# Patient Record
Sex: Female | Born: 1955
Health system: Southern US, Community
[De-identification: ages and names within clinical notes are randomized; demographics above are authoritative.]

## PROBLEM LIST (undated history)

## (undated) DIAGNOSIS — I10 Essential (primary) hypertension: Secondary | ICD-10-CM

## (undated) DIAGNOSIS — M1990 Unspecified osteoarthritis, unspecified site: Secondary | ICD-10-CM

---

## 2001-08-13 ENCOUNTER — Encounter: Payer: Self-pay | Admitting: Family Medicine

## 2001-08-13 ENCOUNTER — Ambulatory Visit (HOSPITAL_COMMUNITY): Admission: RE | Admit: 2001-08-13 | Discharge: 2001-08-13 | Payer: Self-pay | Admitting: Family Medicine

## 2001-11-22 ENCOUNTER — Other Ambulatory Visit: Admission: RE | Admit: 2001-11-22 | Discharge: 2001-11-22 | Payer: Self-pay | Admitting: Family Medicine

## 2002-09-09 ENCOUNTER — Ambulatory Visit (HOSPITAL_COMMUNITY): Admission: RE | Admit: 2002-09-09 | Discharge: 2002-09-09 | Payer: Self-pay | Admitting: Family Medicine

## 2002-09-09 ENCOUNTER — Encounter: Payer: Self-pay | Admitting: Family Medicine

## 2003-10-22 ENCOUNTER — Ambulatory Visit (HOSPITAL_COMMUNITY): Admission: RE | Admit: 2003-10-22 | Discharge: 2003-10-22 | Payer: Self-pay | Admitting: Family Medicine

## 2005-04-11 ENCOUNTER — Ambulatory Visit (HOSPITAL_COMMUNITY): Admission: RE | Admit: 2005-04-11 | Discharge: 2005-04-11 | Payer: Self-pay | Admitting: Family Medicine

## 2006-07-25 ENCOUNTER — Other Ambulatory Visit: Admission: RE | Admit: 2006-07-25 | Discharge: 2006-07-25 | Payer: Self-pay | Admitting: Family Medicine

## 2006-08-08 ENCOUNTER — Ambulatory Visit (HOSPITAL_COMMUNITY): Admission: RE | Admit: 2006-08-08 | Discharge: 2006-08-08 | Payer: Self-pay | Admitting: Family Medicine

## 2007-06-21 ENCOUNTER — Ambulatory Visit (HOSPITAL_COMMUNITY): Admission: RE | Admit: 2007-06-21 | Discharge: 2007-06-21 | Payer: Self-pay | Admitting: Gastroenterology

## 2007-06-21 ENCOUNTER — Ambulatory Visit: Payer: Self-pay | Admitting: Gastroenterology

## 2007-06-21 HISTORY — PX: COLONOSCOPY: SHX174

## 2007-08-30 ENCOUNTER — Ambulatory Visit (HOSPITAL_COMMUNITY): Admission: RE | Admit: 2007-08-30 | Discharge: 2007-08-30 | Payer: Self-pay | Admitting: Family Medicine

## 2007-09-20 ENCOUNTER — Encounter: Payer: Self-pay | Admitting: Orthopedic Surgery

## 2007-09-20 ENCOUNTER — Encounter: Admission: RE | Admit: 2007-09-20 | Discharge: 2007-09-20 | Payer: Self-pay | Admitting: Family Medicine

## 2007-11-13 ENCOUNTER — Ambulatory Visit: Payer: Self-pay | Admitting: Orthopedic Surgery

## 2007-11-13 DIAGNOSIS — M7580 Other shoulder lesions, unspecified shoulder: Secondary | ICD-10-CM

## 2007-11-13 DIAGNOSIS — M25819 Other specified joint disorders, unspecified shoulder: Secondary | ICD-10-CM | POA: Insufficient documentation

## 2007-11-13 DIAGNOSIS — M25519 Pain in unspecified shoulder: Secondary | ICD-10-CM | POA: Insufficient documentation

## 2009-06-23 ENCOUNTER — Ambulatory Visit (HOSPITAL_COMMUNITY): Admission: RE | Admit: 2009-06-23 | Discharge: 2009-06-23 | Payer: Self-pay | Admitting: Family Medicine

## 2010-07-06 ENCOUNTER — Ambulatory Visit (HOSPITAL_COMMUNITY): Admission: RE | Admit: 2010-07-06 | Discharge: 2010-07-06 | Payer: Self-pay | Admitting: Family Medicine

## 2010-10-03 ENCOUNTER — Encounter: Payer: Self-pay | Admitting: Family Medicine

## 2011-01-25 NOTE — Op Note (Signed)
NAMEALVINE, Jodi            ACCOUNT NO.:  [id number]   MEDICAL RECORD NO.:  [id number]          PATIENT TYPE:  AMB   LOCATION:  DAY                           FACILITY:  [facility]   PHYSICIAN:  Kassie Mends, M.D.      DATE OF BIRTH:  [birth year]   DATE OF PROCEDURE:  06/21/2007  DATE OF DISCHARGE:                               OPERATIVE REPORT   PROCEDURE:  Colonoscopy.   INDICATION FOR EXAM:  Jodi Duran is a 55 year old female who  presents for average risk colon cancer screening.  She was supposed to  be on a clear liquid diet but had fried potatoes and pork chops at  around 5:00 p.m. today.   FINDINGS:  1. Large amount of liquid stool seen in the transverse colon and the      descending colon which contained particulate matter.  It made      aspirating liquid difficult but adequate visualization of the      colonic mucosa was able to be achieved.  However, the visualization      was not ideal and polyps less than 5 mm would been easily missed.  2. Otherwise no polyps, masses, inflammatory changes, arteriovenous      malformations or diverticula seen.  3. Small internal hemorrhoids.  Otherwise normal retroflexed view of      the rectum.   RECOMMENDATIONS:  1. Jodi Duran should return in 5 years to have a screening      colonoscopy and should strictly adhere to a clear liquid diet.  I      did stress that to her.  2. She should follow a high fiber diet.  She is given a handout on      high-fiber diet and hemorrhoids.   MEDICATIONS:  1. Demerol 75 mg IV.  2. Versed 4 mg IV.   PROCEDURE TECHNIQUE:  Physical exam was performed.  Informed consent was  obtained from the patient after explaining benefits, risks and  alternatives to the procedure.  The patient was connected to the monitor  and placed in left lateral position.  Continuous oxygen was provided by  nasal cannula and IV medicine administered through an indwelling  cannula.  After administration of sedation and  rectal exam, the  patient's rectum intubated.  The scope was  advanced under direct visualization to the cecum.  The scope was removed  slowly by carefully examine the color, texture, anatomy and integrity  mucosa on the way out.  The patient was recovered in endoscopy  discharged home in satisfactory condition.      Kassie Mends, M.D.  Electronically Signed     SM/MEDQ  D:  06/21/2007  T:  06/21/2007  Job:  [id number]   cc:   Jodi Duran. Jodi Chance, MD  Fax: [phone]

## 2011-07-19 ENCOUNTER — Other Ambulatory Visit (HOSPITAL_COMMUNITY): Payer: Self-pay | Admitting: Family Medicine

## 2011-07-19 DIAGNOSIS — Z1231 Encounter for screening mammogram for malignant neoplasm of breast: Secondary | ICD-10-CM

## 2011-07-21 ENCOUNTER — Ambulatory Visit (HOSPITAL_COMMUNITY)
Admission: RE | Admit: 2011-07-21 | Discharge: 2011-07-21 | Disposition: A | Discharge status: Home / Self Care | Payer: Self-pay | Source: Ambulatory Visit | Attending: Family Medicine | Admitting: Family Medicine

## 2011-07-21 DIAGNOSIS — Z1231 Encounter for screening mammogram for malignant neoplasm of breast: Secondary | ICD-10-CM

## 2012-05-31 ENCOUNTER — Encounter: Payer: Self-pay | Admitting: *Deleted

## 2012-06-29 ENCOUNTER — Other Ambulatory Visit (HOSPITAL_COMMUNITY): Payer: Self-pay | Admitting: *Deleted

## 2012-06-29 DIAGNOSIS — Z1231 Encounter for screening mammogram for malignant neoplasm of breast: Secondary | ICD-10-CM

## 2012-07-03 ENCOUNTER — Other Ambulatory Visit (HOSPITAL_COMMUNITY): Payer: Self-pay | Admitting: *Deleted

## 2012-07-03 DIAGNOSIS — Z139 Encounter for screening, unspecified: Secondary | ICD-10-CM

## 2012-07-10 ENCOUNTER — Ambulatory Visit (HOSPITAL_COMMUNITY): Payer: Self-pay

## 2012-07-23 ENCOUNTER — Ambulatory Visit (HOSPITAL_COMMUNITY)
Admission: RE | Admit: 2012-07-23 | Discharge: 2012-07-23 | Disposition: A | Discharge status: Home / Self Care | Payer: Self-pay | Source: Ambulatory Visit | Attending: *Deleted | Admitting: *Deleted

## 2012-07-23 DIAGNOSIS — Z139 Encounter for screening, unspecified: Secondary | ICD-10-CM

## 2013-02-11 DIAGNOSIS — H16009 Unspecified corneal ulcer, unspecified eye: Secondary | ICD-10-CM | POA: Insufficient documentation

## 2013-07-25 ENCOUNTER — Other Ambulatory Visit (HOSPITAL_COMMUNITY): Payer: Self-pay | Admitting: Family Medicine

## 2013-07-25 DIAGNOSIS — Z1231 Encounter for screening mammogram for malignant neoplasm of breast: Secondary | ICD-10-CM

## 2013-08-01 ENCOUNTER — Ambulatory Visit (HOSPITAL_COMMUNITY)
Admission: RE | Admit: 2013-08-01 | Discharge: 2013-08-01 | Disposition: A | Discharge status: Home / Self Care | Payer: Self-pay | Source: Ambulatory Visit | Attending: Family Medicine | Admitting: Family Medicine

## 2013-08-01 DIAGNOSIS — Z1231 Encounter for screening mammogram for malignant neoplasm of breast: Secondary | ICD-10-CM

## 2014-05-07 ENCOUNTER — Telehealth: Payer: Self-pay

## 2014-05-07 NOTE — Telephone Encounter (Signed)
Pt called on her work break to schedule tcs. She said she was not having any problems that she was just due for one. I told her that the triage nurse was covering for another nurse today and she would have to call her back. Pt said that she was having to call from work and doesn't get home until after we have closed. Patient said that she would have to call DS back later. I took her phone numbers anyway. [phone] home and work is [phone]

## 2014-05-13 NOTE — Telephone Encounter (Signed)
Pt called and is scheduled for OV with Laban Emperor, NP on 06/16/2014 at 3:00 PM. She had colonoscopy on 06/21/2007 and the next was recommended in 5 years.

## 2014-06-16 ENCOUNTER — Encounter (INDEPENDENT_AMBULATORY_CARE_PROVIDER_SITE_OTHER): Payer: Self-pay

## 2014-06-16 ENCOUNTER — Ambulatory Visit (INDEPENDENT_AMBULATORY_CARE_PROVIDER_SITE_OTHER): Payer: BC Managed Care – PPO | Admitting: Gastroenterology

## 2014-06-16 ENCOUNTER — Encounter: Payer: Self-pay | Admitting: Gastroenterology

## 2014-06-16 VITALS — BP 116/71 | HR 72 | Temp 97.0°F | Ht 67.0 in | Wt 248.8 lb

## 2014-06-16 DIAGNOSIS — Z1211 Encounter for screening for malignant neoplasm of colon: Secondary | ICD-10-CM

## 2014-06-18 DIAGNOSIS — Z1211 Encounter for screening for malignant neoplasm of colon: Secondary | ICD-10-CM | POA: Insufficient documentation

## 2014-06-18 DIAGNOSIS — Z136 Encounter for screening for cardiovascular disorders: Secondary | ICD-10-CM | POA: Insufficient documentation

## 2014-06-18 NOTE — Progress Notes (Signed)
Pt presented for visit prior to colonoscopy. No issues. Triaged per nursing. No prior history of polyps. TCS in 2008 with poor prep, so early interval colonoscopy is warranted. No charge; patient triaged.

## 2014-06-24 ENCOUNTER — Other Ambulatory Visit: Payer: Self-pay

## 2014-06-24 ENCOUNTER — Telehealth: Payer: Self-pay

## 2014-06-24 DIAGNOSIS — Z1211 Encounter for screening for malignant neoplasm of colon: Secondary | ICD-10-CM

## 2014-06-24 NOTE — Telephone Encounter (Signed)
PREPOPIK-DRINK WATER TO KEEP URINE LIGHT YELLOW.  PT SHOULD DROP OFF RX 3 DAYS PRIOR TO PROCEDURE.  

## 2014-06-24 NOTE — Telephone Encounter (Signed)
Gastroenterology Pre-Procedure Review  Request Date: 06/16/2014 Requesting Physician: Dr. Iona Beard  PATIENT REVIEW QUESTIONS: The patient responded to the following health history questions as indicated:    1. Diabetes Melitis: no 2. Joint replacements in the past 12 months: no 3. Major health problems in the past 3 months: no 4. Has an artificial valve or MVP: no 5. Has a defibrillator: no 6. Has been advised in past to take antibiotics in advance of a procedure like teeth cleaning: no    MEDICATIONS & ALLERGIES:    Patient reports the following regarding taking any blood thinners:   Plavix? no Aspirin? no Coumadin? no  Patient confirms/reports the following medications:  Current Outpatient Prescriptions  Medication Sig Dispense Refill  . acetaminophen (TYLENOL) 325 MG tablet Take 650 mg by mouth every 6 (six) hours as needed.      . hydrochlorothiazide (HYDRODIURIL) 25 MG tablet Take 25 mg by mouth daily.       No current facility-administered medications for this visit.    Patient confirms/reports the following allergies:  No Known Allergies  No orders of the defined types were placed in this encounter.    AUTHORIZATION INFORMATION Primary Insurance:   ID #: Group #:  Pre-Cert / Auth required:  Pre-Cert / Auth #:   Secondary Insurance:   ID #:  Group #:  Pre-Cert / Auth required:  Pre-Cert / Auth #:   SCHEDULE INFORMATION: Procedure has been scheduled as follows:  Date:  07/11/2014               Time: 10:30 AM Location: [facility] Short Stay  This Gastroenterology Pre-Precedure Review Form is being routed to the following provider(s): Barney Drain, MD

## 2014-06-25 MED ORDER — PEG-KCL-NACL-NASULF-NA ASC-C 100 G PO SOLR: 1.0000 | ORAL | Status: DC

## 2014-06-25 NOTE — Telephone Encounter (Signed)
Rx sent to the pharmacy and instructions mailed to pt.  

## 2014-07-02 ENCOUNTER — Encounter (HOSPITAL_COMMUNITY): Payer: Self-pay | Admitting: Pharmacy Technician

## 2014-07-07 ENCOUNTER — Telehealth: Payer: Self-pay

## 2014-07-07 MED ORDER — PEG-KCL-NACL-NASULF-NA ASC-C 100 G PO SOLR: 1.0000 | ORAL | Status: DC

## 2014-07-07 MED ORDER — PEG 3350-KCL-NA BICARB-NACL 420 G PO SOLR: 4000.0000 mL | ORAL | Status: DC

## 2014-07-07 NOTE — Telephone Encounter (Signed)
Pt called and cannot afford the prepopik.

## 2014-07-07 NOTE — Telephone Encounter (Signed)
I sent in Movie prep and it is too expensive also. I spoke to the pharmacist and asked them to cancel those two and I will send in the Trilyte Prep and fax instructions for that to the pharmacy. Pt was aware that her instructions would be faxed to [facility].

## 2014-07-11 ENCOUNTER — Encounter (HOSPITAL_COMMUNITY)
Admission: RE | Disposition: A | Discharge status: Home / Self Care | Payer: Self-pay | Source: Ambulatory Visit | Attending: Gastroenterology

## 2014-07-11 ENCOUNTER — Ambulatory Visit (HOSPITAL_COMMUNITY)
Admission: RE | Admit: 2014-07-11 | Discharge: 2014-07-11 | Disposition: A | Discharge status: Home / Self Care | Payer: BC Managed Care – PPO | Source: Ambulatory Visit | Attending: Gastroenterology | Admitting: Gastroenterology

## 2014-07-11 ENCOUNTER — Encounter (HOSPITAL_COMMUNITY): Payer: Self-pay | Admitting: *Deleted

## 2014-07-11 DIAGNOSIS — I10 Essential (primary) hypertension: Secondary | ICD-10-CM | POA: Insufficient documentation

## 2014-07-11 DIAGNOSIS — F17210 Nicotine dependence, cigarettes, uncomplicated: Secondary | ICD-10-CM | POA: Diagnosis not present

## 2014-07-11 DIAGNOSIS — Z1211 Encounter for screening for malignant neoplasm of colon: Secondary | ICD-10-CM | POA: Diagnosis not present

## 2014-07-11 DIAGNOSIS — K648 Other hemorrhoids: Secondary | ICD-10-CM | POA: Diagnosis not present

## 2014-07-11 DIAGNOSIS — Q438 Other specified congenital malformations of intestine: Secondary | ICD-10-CM | POA: Diagnosis not present

## 2014-07-11 DIAGNOSIS — K635 Polyp of colon: Secondary | ICD-10-CM

## 2014-07-11 DIAGNOSIS — D125 Benign neoplasm of sigmoid colon: Secondary | ICD-10-CM | POA: Insufficient documentation

## 2014-07-11 DIAGNOSIS — D123 Benign neoplasm of transverse colon: Secondary | ICD-10-CM | POA: Insufficient documentation

## 2014-07-11 HISTORY — DX: Essential (primary) hypertension: I10

## 2014-07-11 HISTORY — PX: COLONOSCOPY: SHX5424

## 2014-07-11 HISTORY — DX: Unspecified osteoarthritis, unspecified site: M19.90

## 2014-07-11 SURGERY — COLONOSCOPY
Anesthesia: Moderate Sedation

## 2014-07-11 MED ORDER — MIDAZOLAM HCL 5 MG/5ML IJ SOLN
INTRAMUSCULAR | Status: DC | PRN
  Administered 2014-07-11 (×2): 2 mg via INTRAVENOUS
  Administered 2014-07-11: 1 mg via INTRAVENOUS

## 2014-07-11 MED ORDER — SIMETHICONE 40 MG/0.6ML PO SUSP
ORAL | Status: DC | PRN
  Administered 2014-07-11: 11:00:00

## 2014-07-11 MED ORDER — SODIUM CHLORIDE 0.9 % IV SOLN
INTRAVENOUS | Status: DC
  Administered 2014-07-11: 1000 mL via INTRAVENOUS

## 2014-07-11 MED ORDER — PROMETHAZINE HCL 25 MG/ML IJ SOLN
INTRAMUSCULAR | Status: AC
  Filled 2014-07-11: qty 1

## 2014-07-11 MED ORDER — MIDAZOLAM HCL 5 MG/5ML IJ SOLN
INTRAMUSCULAR | Status: AC
  Filled 2014-07-11: qty 10

## 2014-07-11 MED ORDER — MEPERIDINE HCL 100 MG/ML IJ SOLN
INTRAMUSCULAR | Status: DC | PRN
  Administered 2014-07-11: 25 mg via INTRAVENOUS
  Administered 2014-07-11: 50 mg via INTRAVENOUS

## 2014-07-11 MED ORDER — PROMETHAZINE HCL 25 MG/ML IJ SOLN
INTRAMUSCULAR | Status: DC | PRN
  Administered 2014-07-11: 12.5 mg via INTRAVENOUS

## 2014-07-11 MED ORDER — MEPERIDINE HCL 100 MG/ML IJ SOLN
INTRAMUSCULAR | Status: AC
  Filled 2014-07-11: qty 2

## 2014-07-11 MED ORDER — SODIUM CHLORIDE 0.9 % IJ SOLN
INTRAMUSCULAR | Status: AC
  Filled 2014-07-11: qty 10

## 2014-07-11 NOTE — H&P (Signed)
  Primary Care Physician:  Maggie Font, MD Primary Gastroenterologist:  Dr. Oneida Alar  Pre-Procedure History & Physical: HPI:  Jodi Duran is a 58 y.o. female here for [place].  Past Medical History  Diagnosis Date  . Hypertension   . Arthritis     Past Surgical History  Procedure Laterality Date  . Colonoscopy  06/21/2007    HDQ:QIWLN internal hemorrhoids/Large amount of liquid stool seen in the transverse colon and descending colon which contained particulate matter    Prior to Admission medications   Medication Sig Start Date End Date Taking? Authorizing Provider  acetaminophen (TYLENOL) 325 MG tablet Take 650 mg by mouth every 6 (six) hours as needed.   Yes Historical Provider, MD  Conj Estrogens-Bazedoxifene (DUAVEE) 0.45-20 MG TABS Take 1 tablet by mouth daily.   Yes Historical Provider, MD  hydrochlorothiazide (HYDRODIURIL) 25 MG tablet Take 25 mg by mouth daily.   Yes Historical Provider, MD  peg 3350 powder (MOVIPREP) 100 G SOLR Take 1 kit (200 g total) by mouth as directed. 07/07/14  Yes Danie Binder, MD  polyethylene glycol-electrolytes (TRILYTE) 420 G solution Take 4,000 mLs by mouth as directed. 07/07/14  Yes Danie Binder, MD    Allergies as of 06/24/2014  . (No Known Allergies)    History reviewed. No pertinent family history.  History   Social History  . Marital Status: Single    Spouse Name: N/A    Number of Children: N/A  . Years of Education: N/A   Occupational History  . Not on file.   Social History Main Topics  . Smoking status: Current Every Day Smoker -- 0.25 packs/day for 15 years    Types: Cigarettes  . Smokeless tobacco: Not on file     Comment:  5 cigarettes daily  . Alcohol Use: Yes     Comment: Socially  . Drug Use: No  . Sexual Activity: Not on file   Other Topics Concern  . Not on file   Social History Narrative  . No narrative on file    Review of Systems: See HPI, otherwise negative  ROS   Physical Exam: BP 123/78  Pulse 64  Temp(Src) 97.6 F (36.4 C) (Oral)  Resp 20  Ht _0  (1.702 m)  Wt 230 lb (104.327 kg)  BMI 36.01 kg/m2  SpO2 94% General:   Alert,  pleasant and cooperative in NAD Head:  Normocephalic and atraumatic. Neck:  Supple; Lungs:  Clear throughout to auscultation.    Heart:  Regular rate and rhythm. Abdomen:  Soft, nontender and nondistended. Normal bowel sounds, without guarding, and without rebound.   Neurologic:  Alert and  oriented x4;  grossly normal neurologically.  Impression/Plan:     SCREENING  Plan:  1. TCS TODAY

## 2014-07-11 NOTE — Discharge Instructions (Signed)
You had 5 polyps removed. You have internal hemorrhoids.   FOLLOW A HIGH FIBER DIET. AVOID ITEMS THAT CAUSE BLOATING & GAS. SEE INFO BELOW.  YOUR BIOPSY RESULTS SHOULD BE BACK IN 14 DAYS.  Next colonoscopy in 5-10 years.    Colonoscopy Care After Read the instructions outlined below and refer to this sheet in the next week. These discharge instructions provide you with general information on caring for yourself after you leave the hospital. While your treatment has been planned according to the most current medical practices available, unavoidable complications occasionally occur. If you have any problems or questions after discharge, call DR. Ahmon Tosi, [phone].  ACTIVITY  You may resume your regular activity, but move at a slower pace for the next 24 hours.   Take frequent rest periods for the next 24 hours.   Walking will help get rid of the air and reduce the bloated feeling in your belly (abdomen).   No driving for 24 hours (because of the medicine (anesthesia) used during the test).   You may shower.   Do not sign any important legal documents or operate any machinery for 24 hours (because of the anesthesia used during the test).    NUTRITION  Drink plenty of fluids.   You may resume your normal diet as instructed by your doctor.   Begin with a light meal and progress to your normal diet. Heavy or fried foods are harder to digest and may make you feel sick to your stomach (nauseated).   Avoid alcoholic beverages for 24 hours or as instructed.    MEDICATIONS  You may resume your normal medications.   WHAT YOU CAN EXPECT TODAY  Some feelings of bloating in the abdomen.   Passage of more gas than usual.   Spotting of blood in your stool or on the toilet paper  .  IF YOU HAD POLYPS REMOVED DURING THE COLONOSCOPY:  Eat a soft diet IF YOU HAVE NAUSEA, BLOATING, ABDOMINAL PAIN, OR VOMITING.    FINDING OUT THE RESULTS OF YOUR TEST Not all test results are  available during your visit. DR. Oneida Alar WILL CALL YOU WITHIN 7 DAYS OF YOUR PROCEDUE WITH YOUR RESULTS. Do not assume everything is normal if you have not heard from DR. Laniqua Torrens IN ONE WEEK, CALL HER OFFICE AT [phone].  SEEK IMMEDIATE MEDICAL ATTENTION AND CALL THE OFFICE: [phone] IF:  You have more than a spotting of blood in your stool.   Your belly is swollen (abdominal distention).   You are nauseated or vomiting.   You have a temperature over 101F.   You have abdominal pain or discomfort that is severe or gets worse throughout the day.   High-Fiber Diet A high-fiber diet changes your normal diet to include more whole grains, legumes, fruits, and vegetables. Changes in the diet involve replacing refined carbohydrates with unrefined foods. The calorie level of the diet is essentially unchanged. The Dietary Reference Intake (recommended amount) for adult males is 38 grams per day. For adult females, it is 25 grams per day. Pregnant and lactating women should consume 28 grams of fiber per day. Fiber is the intact part of a plant that is not broken down during digestion. Functional fiber is fiber that has been isolated from the plant to provide a beneficial effect in the body. PURPOSE  Increase stool bulk.   Ease and regulate bowel movements.   Lower cholesterol.  INDICATIONS THAT YOU NEED MORE FIBER  Constipation and hemorrhoids.   Uncomplicated diverticulosis (  intestine condition) and irritable bowel syndrome.   Weight management.   As a protective measure against hardening of the arteries (atherosclerosis), diabetes, and cancer.   GUIDELINES FOR INCREASING FIBER IN THE DIET  Start adding fiber to the diet slowly. A gradual increase of about 5 more grams (2 slices of whole-wheat bread, 2 servings of most fruits or vegetables, or 1 bowl of high-fiber cereal) per day is best. Too rapid an increase in fiber may result in constipation, flatulence, and bloating.   Drink  enough water and fluids to keep your urine clear or pale yellow. Water, juice, or caffeine-free drinks are recommended. Not drinking enough fluid may cause constipation.   Eat a variety of high-fiber foods rather than one type of fiber.   Try to increase your intake of fiber through using high-fiber foods rather than fiber pills or supplements that contain small amounts of fiber.   The goal is to change the types of food eaten. Do not supplement your present diet with high-fiber foods, but replace foods in your present diet.  INCLUDE A VARIETY OF FIBER SOURCES  Replace refined and processed grains with whole grains, canned fruits with fresh fruits, and incorporate other fiber sources. White rice, white breads, and most bakery goods contain little or no fiber.   Brown whole-grain rice, buckwheat oats, and many fruits and vegetables are all good sources of fiber. These include: broccoli, Brussels sprouts, cabbage, cauliflower, beets, sweet potatoes, white potatoes (skin on), carrots, tomatoes, eggplant, squash, berries, fresh fruits, and dried fruits.   Cereals appear to be the richest source of fiber. Cereal fiber is found in whole grains and bran. Bran is the fiber-rich outer coat of cereal grain, which is largely removed in refining. In whole-grain cereals, the bran remains. In breakfast cereals, the largest amount of fiber is found in those with "bran" in their names. The fiber content is sometimes indicated on the label.   You may need to include additional fruits and vegetables each day.   In baking, for 1 cup white flour, you may use the following substitutions:   1 cup whole-wheat flour minus 2 tablespoons.   1/2 cup white flour plus 1/2 cup whole-wheat flour.   Polyps, Colon  A polyp is extra tissue that grows inside your body. Colon polyps grow in the large intestine. The large intestine, also called the colon, is part of your digestive system. It is a long, hollow tube at the end of  your digestive tract where your body makes and stores stool. Most polyps are not dangerous. They are benign. This means they are not cancerous. But over time, some types of polyps can turn into cancer. Polyps that are smaller than a pea are usually not harmful. But larger polyps could someday become or may already be cancerous. To be safe, doctors remove all polyps and test them.   WHO GETS POLYPS? Anyone can get polyps, but certain people are more likely than others. You may have a greater chance of getting polyps if:  You are over 50.   You have had polyps before.   Someone in your family has had polyps.   Someone in your family has had cancer of the large intestine.   Find out if someone in your family has had polyps. You may also be more likely to get polyps if you:   Eat a lot of fatty foods   Smoke   Drink alcohol   Do not exercise  Eat too much  TREATMENT  The caregiver will remove the polyp during sigmoidoscopy or colonoscopy.    PREVENTION There is not one sure way to prevent polyps. You might be able to lower your risk of getting them if you:  Eat more fruits and vegetables and less fatty food.   Do not smoke.   Avoid alcohol.   Exercise every day.   Lose weight if you are overweight.   Eating more calcium and folate can also lower your risk of getting polyps. Some foods that are rich in calcium are milk, cheese, and broccoli. Some foods that are rich in folate are chickpeas, kidney beans, and spinach.   Hemorrhoids Hemorrhoids are dilated (enlarged) veins around the rectum. Sometimes clots will form in the veins. This makes them swollen and painful. These are called thrombosed hemorrhoids. Causes of hemorrhoids include:  Constipation.   Straining to have a bowel movement.   HEAVY LIFTING HOME CARE INSTRUCTIONS  Eat a well balanced diet and drink 6 to 8 glasses of water every day to avoid constipation. You may also use a bulk laxative.   Avoid  straining to have bowel movements.   Keep anal area dry and clean.   Do not use a donut shaped pillow or sit on the toilet for long periods. This increases blood pooling and pain.   Move your bowels when your body has the urge; this will require less straining and will decrease pain and pressure.

## 2014-07-12 NOTE — Op Note (Signed)
[facility] [address] [place], [address]   COLONOSCOPY PROCEDURE REPORT  PATIENT: Jodi Duran, Jodi Duran  MR#: [id number] BIRTHDATE: September 04, 1956 , 71  yrs. old GENDER: female ENDOSCOPIST: Barney Drain, MD REFERRED OF:BPZWCH Hill, M.D. PROCEDURE DATE:  07/11/2014 PROCEDURE:   Colonoscopy with cold biopsy polypectomy INDICATIONS:average risk for colon cancer.  LAST [id number] INCOMPLETE DUE TO [place]aird PREP IN RIGHT COLON. REMEMBERED SOME EVENTS OF LAST TCS. MEDICATIONS: Promethazine (Phenergan) 12.5 mg IV, Demerol 75 mg IV, and Versed 5 mg IV  DESCRIPTION OF PROCEDURE:    Physical exam was performed.  Informed consent was obtained from the patient after explaining the benefits, risks, and alternatives to procedure.  The patient was connected to monitor and placed in left lateral position. Continuous oxygen was provided by nasal cannula and IV medicine administered through an indwelling cannula.  After administration of sedation and rectal exam, the patients rectum was intubated and the EC-3890Li ([id number])  colonoscope was advanced under direct visualization to the cecum.  The scope was removed slowly by carefully examining the color, texture, anatomy, and integrity mucosa on the way out.  The patient was recovered in endoscopy and discharged home in satisfactory condition.    COLON FINDINGS: Six sessile polyps ranging from 3 to 31mm in size were found in the sigmoid colon, distal transverse colon, and descending colon.  A polypectomy was performed with cold forceps. , The colon was redundant.  Manual abdominal counter-pressure was used to reach the cecum, and Moderate sized internal hemorrhoids were found.  PREP QUALITY: good. CECAL W/D TIME: 17 MINS         COMPLICATIONS: None  ENDOSCOPIC IMPRESSION: 1.   Six COLON POLYPS REMOVED 2.   The LEFT COLON IS redundant 3.   Moderate sized internal hemorrhoids  RECOMMENDATIONS: HIGH FIBER DIET AWAIT BIOPSY NEXT  TCS IN 5-10 YEARS WITH AN OVERTUBE   _______________________________ eSignedBarney Drain, MD August 07, 2014 9:45 PM reCPT CODES: ICD CODES:  The ICD and CPT codes recommended by this software are interpretations from the data that the clinical staff has captured with the software.  The verification of the translation of this report to the ICD and CPT codes and modifiers is the sole responsibility of the health care institution and practicing physician where this report was generated.  [employer] will not be held responsible for the validity of the ICD and CPT codes included on this report.  AMA assumes no liability for data contained or not contained herein. CPT is a Designer, television/film set of the [employer].

## 2014-08-28 ENCOUNTER — Telehealth: Payer: Self-pay | Admitting: Gastroenterology

## 2014-08-28 NOTE — Telephone Encounter (Signed)
Called pt and LMOM to call back regarding path results

## 2014-08-28 NOTE — Telephone Encounter (Signed)
Please call pt. She had HYPERPLASTIC POLYPS removed from her colon.    FOLLOW A HIGH FIBER DIET. AVOID ITEMS THAT CAUSE BLOATING & GAS.  Next colonoscopy in 10 years WITH AN OVERTUBE.

## 2014-08-29 NOTE — Telephone Encounter (Signed)
Pt called and is aware of results from colonoscopy.

## 2014-09-11 NOTE — Telephone Encounter (Signed)
Reminder in epic °

## 2015-06-18 ENCOUNTER — Other Ambulatory Visit (HOSPITAL_COMMUNITY): Payer: Self-pay | Admitting: *Deleted

## 2015-06-18 DIAGNOSIS — Z1231 Encounter for screening mammogram for malignant neoplasm of breast: Secondary | ICD-10-CM

## 2015-07-01 ENCOUNTER — Ambulatory Visit (HOSPITAL_COMMUNITY): Payer: Self-pay

## 2015-07-27 ENCOUNTER — Ambulatory Visit (HOSPITAL_COMMUNITY): Payer: Self-pay

## 2015-08-03 ENCOUNTER — Ambulatory Visit (HOSPITAL_COMMUNITY)
Admission: RE | Admit: 2015-08-03 | Discharge: 2015-08-03 | Disposition: A | Discharge status: Home / Self Care | Payer: PRIVATE HEALTH INSURANCE | Source: Ambulatory Visit | Attending: *Deleted | Admitting: *Deleted

## 2015-08-03 DIAGNOSIS — Z1231 Encounter for screening mammogram for malignant neoplasm of breast: Secondary | ICD-10-CM | POA: Diagnosis not present

## 2016-03-08 DIAGNOSIS — Z01419 Encounter for gynecological examination (general) (routine) without abnormal findings: Secondary | ICD-10-CM | POA: Diagnosis not present

## 2016-03-08 DIAGNOSIS — Z1151 Encounter for screening for human papillomavirus (HPV): Secondary | ICD-10-CM | POA: Diagnosis not present

## 2016-03-14 DIAGNOSIS — I10 Essential (primary) hypertension: Secondary | ICD-10-CM | POA: Diagnosis not present

## 2016-03-14 DIAGNOSIS — E785 Hyperlipidemia, unspecified: Secondary | ICD-10-CM | POA: Diagnosis not present

## 2016-03-14 DIAGNOSIS — E669 Obesity, unspecified: Secondary | ICD-10-CM | POA: Diagnosis not present

## 2016-03-14 DIAGNOSIS — Z0000 Encounter for general adult medical examination without abnormal findings: Secondary | ICD-10-CM | POA: Diagnosis not present

## 2016-05-10 ENCOUNTER — Ambulatory Visit (HOSPITAL_COMMUNITY)
Admission: RE | Admit: 2016-05-10 | Discharge: 2016-05-10 | Disposition: A | Discharge status: Home / Self Care | Payer: BLUE CROSS/BLUE SHIELD | Source: Ambulatory Visit | Attending: Family Medicine | Admitting: Family Medicine

## 2016-05-10 ENCOUNTER — Other Ambulatory Visit (HOSPITAL_COMMUNITY): Payer: Self-pay | Admitting: Family Medicine

## 2016-05-10 DIAGNOSIS — F17210 Nicotine dependence, cigarettes, uncomplicated: Secondary | ICD-10-CM | POA: Diagnosis not present

## 2016-05-10 DIAGNOSIS — F17200 Nicotine dependence, unspecified, uncomplicated: Secondary | ICD-10-CM

## 2016-09-08 DIAGNOSIS — Z23 Encounter for immunization: Secondary | ICD-10-CM | POA: Diagnosis not present

## 2016-09-08 DIAGNOSIS — E785 Hyperlipidemia, unspecified: Secondary | ICD-10-CM | POA: Diagnosis not present

## 2016-09-08 DIAGNOSIS — N393 Stress incontinence (female) (male): Secondary | ICD-10-CM | POA: Diagnosis not present

## 2016-09-08 DIAGNOSIS — I10 Essential (primary) hypertension: Secondary | ICD-10-CM | POA: Diagnosis not present

## 2016-11-09 ENCOUNTER — Other Ambulatory Visit (HOSPITAL_COMMUNITY): Payer: Self-pay | Admitting: Family Medicine

## 2016-11-09 DIAGNOSIS — Z1231 Encounter for screening mammogram for malignant neoplasm of breast: Secondary | ICD-10-CM

## 2016-11-23 ENCOUNTER — Ambulatory Visit (HOSPITAL_COMMUNITY)
Admission: RE | Admit: 2016-11-23 | Discharge: 2016-11-23 | Disposition: A | Discharge status: Home / Self Care | Payer: BLUE CROSS/BLUE SHIELD | Source: Ambulatory Visit | Attending: Family Medicine | Admitting: Family Medicine

## 2016-11-23 DIAGNOSIS — Z1231 Encounter for screening mammogram for malignant neoplasm of breast: Secondary | ICD-10-CM | POA: Diagnosis not present

## 2017-03-27 DIAGNOSIS — Z01419 Encounter for gynecological examination (general) (routine) without abnormal findings: Secondary | ICD-10-CM | POA: Diagnosis not present

## 2017-03-27 DIAGNOSIS — N393 Stress incontinence (female) (male): Secondary | ICD-10-CM | POA: Diagnosis not present

## 2017-03-27 DIAGNOSIS — N814 Uterovaginal prolapse, unspecified: Secondary | ICD-10-CM | POA: Diagnosis not present

## 2017-03-27 DIAGNOSIS — Z6837 Body mass index (BMI) 37.0-37.9, adult: Secondary | ICD-10-CM | POA: Diagnosis not present

## 2017-03-31 DIAGNOSIS — N8111 Cystocele, midline: Secondary | ICD-10-CM | POA: Diagnosis not present

## 2017-03-31 DIAGNOSIS — N814 Uterovaginal prolapse, unspecified: Secondary | ICD-10-CM | POA: Diagnosis not present

## 2017-05-20 DIAGNOSIS — N393 Stress incontinence (female) (male): Secondary | ICD-10-CM | POA: Diagnosis not present

## 2017-05-20 DIAGNOSIS — I10 Essential (primary) hypertension: Secondary | ICD-10-CM | POA: Diagnosis not present

## 2017-05-20 DIAGNOSIS — E669 Obesity, unspecified: Secondary | ICD-10-CM | POA: Diagnosis not present

## 2017-05-20 DIAGNOSIS — E785 Hyperlipidemia, unspecified: Secondary | ICD-10-CM | POA: Diagnosis not present

## 2017-09-23 DIAGNOSIS — E785 Hyperlipidemia, unspecified: Secondary | ICD-10-CM | POA: Diagnosis not present

## 2017-09-23 DIAGNOSIS — E669 Obesity, unspecified: Secondary | ICD-10-CM | POA: Diagnosis not present

## 2017-09-23 DIAGNOSIS — N393 Stress incontinence (female) (male): Secondary | ICD-10-CM | POA: Diagnosis not present

## 2017-09-23 DIAGNOSIS — I10 Essential (primary) hypertension: Secondary | ICD-10-CM | POA: Diagnosis not present

## 2019-01-18 ENCOUNTER — Ambulatory Visit (INDEPENDENT_AMBULATORY_CARE_PROVIDER_SITE_OTHER): Payer: BLUE CROSS/BLUE SHIELD | Admitting: Orthopedic Surgery

## 2019-01-18 ENCOUNTER — Other Ambulatory Visit: Payer: Self-pay

## 2019-01-18 ENCOUNTER — Encounter: Payer: Self-pay | Admitting: Orthopedic Surgery

## 2019-01-18 DIAGNOSIS — M79604 Pain in right leg: Secondary | ICD-10-CM | POA: Diagnosis not present

## 2019-01-18 DIAGNOSIS — M79605 Pain in left leg: Secondary | ICD-10-CM | POA: Diagnosis not present

## 2019-01-18 DIAGNOSIS — M170 Bilateral primary osteoarthritis of knee: Secondary | ICD-10-CM

## 2019-01-18 DIAGNOSIS — M5137 Other intervertebral disc degeneration, lumbosacral region: Secondary | ICD-10-CM | POA: Diagnosis not present

## 2019-01-18 NOTE — Progress Notes (Signed)
Chief Complaint  Patient presents with  . Leg Pain    Severe leg pain for 6 weeks starting March 58    63 year old female started having bilateral leg pain severe 10 out of 10 on March 22.  Her pain increases with lying down in bed.  She has bilateral leg pain with numbness from her knees to her toes associated with giving way symptoms and dull aching pain.  She was treated with cyclobenzaprine ibuprofen and naproxen with Prilosec combo tablet and prednisone with improvement which included improved walking and decreased pain although symptoms still persist  Past Medical History:  Diagnosis Date  . Arthritis   . Hypertension    Past Surgical History:  Procedure Laterality Date  . COLONOSCOPY  06/21/2007   TWS:FKCLE internal hemorrhoids/Large amount of liquid stool seen in the transverse colon and descending colon which contained particulate matter  . COLONOSCOPY N/A 07/11/2014   Procedure: COLONOSCOPY;  Surgeon: Danie Binder, MD;  Location: [facility];  Service: Endoscopy;  Laterality: N/A;  10:30  - moved to 10:45 - Ginger to notify pt   . Current Outpatient Medications on File Prior to Visit  Medication Sig Dispense Refill  . cyclobenzaprine (FLEXERIL) 5 MG tablet Take 5 mg by mouth 3 (three) times daily as needed for muscle spasms.    . hydrochlorothiazide (HYDRODIURIL) 25 MG tablet Take 25 mg by mouth daily.    Marland Kitchen ibuprofen (ADVIL) 800 MG tablet Take 800 mg by mouth every 8 (eight) hours as needed.    . Naproxen-Esomeprazole (VIMOVO) 500-20 MG TBEC Take by mouth.    . predniSONE (DELTASONE) 5 MG tablet Take 5 mg by mouth daily with breakfast.    . acetaminophen (TYLENOL) 325 MG tablet Take 650 mg by mouth every 6 (six) hours as needed.    Adalberto Ill Estrogens-Bazedoxifene (DUAVEE) 0.45-20 MG TABS Take 1 tablet by mouth daily.    . hydrochlorothiazide (HYDRODIURIL) 25 MG tablet Take 25 mg by mouth daily.     No current facility-administered medications on file prior to visit.      The patient exhibits signs of possible arthritis versus spinal stenosis or disc disease in the lumbar spine.  Her aching pain could be coming from the knees and the numbness tingling and giving way from the degenerative disc disease  Recommend in person visit for imaging of both knees and her lumbar spine.  Medical decision making  New problem x 2 ,  further work-up planned for each problem Data Dr. Berdine Addison has provided medical records which were reviewed are incorporated by reference and confirmed the patient symptoms medications given.  Prescription medications to continue include cyclobenzaprine and ibuprofen   She is advised to continue the medications that she is on  Encounter Diagnoses  Name Primary?  . Pain in both lower extremities Yes  . Primary osteoarthritis of both knees   . DDD (degenerative disc disease), lumbosacral     Virtual Visit via Telephone Note  I connected with Jodi Duran on 01/18/19 at 10:40 AM EDT by telephone and verified that I am speaking with the correct person using two identifiers.  Location: Patient: home  Provider: office   I discussed the limitations, risks, security and privacy concerns of performing an evaluation and management service by telephone and the availability of in person appointments. I also discussed with the patient that there may be a patient responsible charge related to this service. The patient expressed understanding and agreed to proceed.  I discussed the assessment and treatment plan with the patient. The patient was provided an opportunity to ask questions and all were answered. The patient agreed with the plan and demonstrated an understanding of the instructions.   The patient was advised to call back or seek an in-person evaluation if the symptoms worsen or if the condition fails to improve as anticipated.  I provided 8 48 sec  of non-face-to-face time during this encounter.   Arther Abbott, MD

## 2019-01-25 ENCOUNTER — Ambulatory Visit: Payer: BLUE CROSS/BLUE SHIELD | Admitting: Orthopedic Surgery

## 2019-02-01 ENCOUNTER — Ambulatory Visit (INDEPENDENT_AMBULATORY_CARE_PROVIDER_SITE_OTHER): Payer: BLUE CROSS/BLUE SHIELD

## 2019-02-01 ENCOUNTER — Encounter: Payer: Self-pay | Admitting: Orthopedic Surgery

## 2019-02-01 ENCOUNTER — Ambulatory Visit: Payer: BLUE CROSS/BLUE SHIELD | Admitting: Orthopedic Surgery

## 2019-02-01 ENCOUNTER — Other Ambulatory Visit: Payer: Self-pay

## 2019-02-01 VITALS — Temp 97.3°F | Ht 68.0 in | Wt 253.0 lb

## 2019-02-01 DIAGNOSIS — M25562 Pain in left knee: Secondary | ICD-10-CM | POA: Diagnosis not present

## 2019-02-01 DIAGNOSIS — M5440 Lumbago with sciatica, unspecified side: Secondary | ICD-10-CM | POA: Diagnosis not present

## 2019-02-01 DIAGNOSIS — M170 Bilateral primary osteoarthritis of knee: Secondary | ICD-10-CM | POA: Diagnosis not present

## 2019-02-01 DIAGNOSIS — M25561 Pain in right knee: Secondary | ICD-10-CM

## 2019-02-01 NOTE — Progress Notes (Signed)
Progress Note   Patient ID: Jodi Duran, female   DOB: [birth year], 63 y.o.   MRN: [id number]   Chief Complaint  Patient presents with  . Knee Pain    After fall in 12/03/18 right worse than left    HERE FOR RE-EVALUATION:   LAST VISIT [facility] Chief Complaint Patient presents with . Leg Pain     Severe leg pain for 6 weeks starting March 7     63 year old female started having bilateral leg pain severe 10 out of 10 on March 22.  Her pain increases with lying down in bed.  She has bilateral leg pain with numbness from her knees to her toes associated with giving way symptoms and dull aching pain.   She was treated with cyclobenzaprine ibuprofen and naproxen with Prilosec combo tablet and prednisone with improvement which included improved walking and decreased pain although symptoms still persist   Review of Systems  Constitutional: Negative for fever.  Musculoskeletal: Positive for back pain and joint pain.  Neurological: Negative for tingling, sensory change and focal weakness.   Current Meds  Medication Sig  . celecoxib (CELEBREX) 100 MG capsule TAKE 1 CAPSULE BY MOUTH TWICE DAILY FOR JOINT PAIN  . hydrochlorothiazide (HYDRODIURIL) 25 MG tablet Take 25 mg by mouth daily.  . naproxen (NAPROSYN) 500 MG tablet Take 500 mg by mouth 2 (two) times daily.    Past Medical History:  Diagnosis Date  . Arthritis   . Hypertension      No Known Allergies   Temp (!) 97.3 F (36.3 C)   Ht 5\' 8"  (1.727 m)   Wt 253 lb (114.8 kg)   BMI 38.47 kg/m    Physical Exam General appearance normal Oriented x3 normal Mood pleasant affect normal Gait NORMAL   Ortho Exam RIGHT KNEE  Inspection and palpation revealed posterior popliteal fossa was tender as was the medial and lateral joint line  range of motion is 0-1 15 No instability was detected on stress testing Muscle tone and strength was normal without tremor Skin was warm dry and intact Good pulse and temperature  were noted in the extremity Sensation revealed no abnormalities to light touch   LEFT KNEE Tenderness over the popliteal fossa medial joint line Left knee flex 125 Ligaments are stable Muscle tone is normal Skin is warm dry and intact Pulses and temperature are normal Sensation is excellent to soft touch    MEDICAL DECISION MAKING   Imaging:  X-rays were taken of her right left knee and spine  See dictated reports  X-ray show arthritis mild to moderate both knees and moderate facet arthritis L4-S1   Encounter Diagnoses  Name Primary?  . Low back pain with sciatica, sciatica laterality unspecified, unspecified back pain laterality, unspecified chronicity Yes  . Pain in both knees, unspecified chronicity      PLAN: (RX., injection, surgery,frx,mri/ct, XR 2 body ares) Commend injection both knees Continue Naprosyn Physical therapy for the back  Follow-up in a year repeat x-rays both knees   Procedure note for bilateral knee injections  Procedure note left knee injection verbal consent was obtained to inject left knee joint  Timeout was completed to confirm the site of injection  The medications used were 40 mg of Depo-Medrol and 1% lidocaine 3 cc  Anesthesia was provided by ethyl chloride and the skin was prepped with alcohol.  After cleaning the skin with alcohol a 20-gauge needle was used to inject the left knee joint. There were no complications.  A sterile bandage was applied.   Procedure note right knee injection verbal consent was obtained to inject right knee joint  Timeout was completed to confirm the site of injection  The medications used were 40 mg of Depo-Medrol and 1% lidocaine 3 cc  Anesthesia was provided by ethyl chloride and the skin was prepped with alcohol.  After cleaning the skin with alcohol a 20-gauge needle was used to inject the right knee joint. There were no complications. A sterile bandage was applied.   No orders of the defined  types were placed in this encounter.  9:42 AM 02/01/2019

## 2019-02-01 NOTE — Patient Instructions (Addendum)
Chronic Back Pain When back pain lasts longer than 3 months, it is called chronic back pain.The cause of your back pain may not be known. Some common causes include:  Wear and tear (degenerative disease) of the bones, ligaments, or disks in your back.  Inflammation and stiffness in your back (arthritis). People who have chronic back pain often go through certain periods in which the pain is more intense (flare-ups). Many people can learn to manage the pain with home care. Follow these instructions at home: Pay attention to any changes in your symptoms. Take these actions to help with your pain: Activity   Avoid bending and other activities that make the problem worse.  Maintain a proper position when standing or sitting: ? When standing, keep your upper back and neck straight, with your shoulders pulled back. Avoid slouching. ? When sitting, keep your back straight and relax your shoulders. Do not round your shoulders or pull them backward.  Do not sit or stand in one place for long periods of time.  Take brief periods of rest throughout the day. This will reduce your pain. Resting in a lying or standing position is usually better than sitting to rest.  When you are resting for longer periods, mix in some mild activity or stretching between periods of rest. This will help to prevent stiffness and pain.  Get regular exercise. Ask your health care provider what activities are safe for you.  Do not lift anything that is heavier than 10 lb (4.5 kg). Always use proper lifting technique, which includes: ? Bending your knees. ? Keeping the load close to your body. ? Avoiding twisting.  Sleep on a firm mattress in a comfortable position. Try lying on your side with your knees slightly bent. If you lie on your back, put a pillow under your knees. Managing pain  If directed, apply ice to the painful area. Your health care provider may recommend applying ice during the first 24-48 hours after  a flare-up begins. ? Put ice in a plastic bag. ? Place a towel between your skin and the bag. ? Leave the ice on for 20 minutes, 2-3 times per day.  If directed, apply heat to the affected area as often as told by your health care provider. Use the heat source that your health care provider recommends, such as a moist heat pack or a heating pad. ? Place a towel between your skin and the heat source. ? Leave the heat on for 20-30 minutes. ? Remove the heat if your skin turns bright red. This is especially important if you are unable to feel pain, heat, or cold. You may have a greater risk of getting burned.  Try soaking in a warm tub.  Take over-the-counter and prescription medicines only as told by your health care provider.  Keep all follow-up visits as told by your health care provider. This is important. Contact a health care provider if:  You have pain that is not relieved with rest or medicine. Get help right away if:  You have weakness or numbness in one or both of your legs or feet.  You have trouble controlling your bladder or your bowels.  You have nausea or vomiting.  You have pain in your abdomen.  You have shortness of breath or you faint. This information is not intended to replace advice given to you by your health care provider. Make sure you discuss any questions you have with your health care provider. Document Released: 10/06/2004   Document Revised: 04/05/2018 Document Reviewed: 03/08/2017 Elsevier Interactive Patient Education  2019 [employer].  Arthritis Arthritis is a term that is commonly used to refer to joint pain or joint disease. There are more than 100 types of arthritis. What are the causes? The most common cause of this condition is wear and tear of a joint. Other causes include:  Gout.  Inflammation of a joint.  An infection of a joint.  Sprains and other injuries near the joint.  A drug reaction or allergic reaction. In some cases, the  cause may not be known. What are the signs or symptoms? The main symptom of this condition is pain in the joint with movement. Other symptoms include:  Redness, swelling, or stiffness at a joint.  Warmth coming from the joint.  Fever.  Overall feeling of illness. How is this diagnosed? This condition may be diagnosed with a physical exam and tests, including:  Blood tests.  Urine tests.  Imaging tests, such as MRI, X-rays, or a CT scan. Sometimes, fluid is removed from a joint for testing. How is this treated? Treatment for this condition may involve:  Treatment of the cause, if it is known.  Rest.  Raising (elevating) the joint.  Applying cold or hot packs to the joint.  Medicines to improve symptoms and reduce inflammation.  Injections of a steroid such as cortisone into the joint to help reduce pain and inflammation. Depending on the cause of your arthritis, you may need to make lifestyle changes to reduce stress on your joint. These changes may include exercising more and losing weight. Follow these instructions at home: Medicines  Take over-the-counter and prescription medicines only as told by your health care provider.  Do not take aspirin to relieve pain if gout is suspected. Activity  Rest your joint if told by your health care provider. Rest is important when your disease is active and your joint feels painful, swollen, or stiff.  Avoid activities that make the pain worse. It is important to balance activity with rest.  Exercise your joint regularly with range-of-motion exercises as told by your health care provider. Try doing low-impact exercise, such as: ? Swimming. ? Water aerobics. ? Biking. ? Walking. Joint Care   If your joint is swollen, keep it elevated if told by your health care provider.  If your joint feels stiff in the morning, try taking a warm shower.  If directed, apply heat to the joint. If you have diabetes, do not apply heat  without permission from your health care provider. ? Put a towel between the joint and the hot pack or heating pad. ? Leave the heat on the area for 20-30 minutes.  If directed, apply ice to the joint: ? Put ice in a plastic bag. ? Place a towel between your skin and the bag. ? Leave the ice on for 20 minutes, 2-3 times per day.  Keep all follow-up visits as told by your health care provider. This is important. Contact a health care provider if:  The pain gets worse.  You have a fever. Get help right away if:  You develop severe joint pain, swelling, or redness.  Many joints become painful and swollen.  You develop severe back pain.  You develop severe weakness in your leg.  You cannot control your bladder or bowels. This information is not intended to replace advice given to you by your health care provider. Make sure you discuss any questions you have with your health care provider.  Document Released: 10/06/2004 Document Revised: 02/04/2016 Document Reviewed: 11/24/2014 Elsevier Interactive Patient Education  2019 [employer].   Continue naproxen

## 2019-02-06 ENCOUNTER — Other Ambulatory Visit (HOSPITAL_COMMUNITY): Payer: Self-pay | Admitting: Family Medicine

## 2019-02-06 DIAGNOSIS — Z1231 Encounter for screening mammogram for malignant neoplasm of breast: Secondary | ICD-10-CM

## 2019-02-28 ENCOUNTER — Other Ambulatory Visit: Payer: BC Managed Care – PPO

## 2019-02-28 ENCOUNTER — Other Ambulatory Visit: Payer: Self-pay | Admitting: Internal Medicine

## 2019-02-28 DIAGNOSIS — Z20822 Contact with and (suspected) exposure to covid-19: Secondary | ICD-10-CM

## 2019-03-04 ENCOUNTER — Other Ambulatory Visit: Payer: Self-pay

## 2019-03-04 ENCOUNTER — Ambulatory Visit (HOSPITAL_COMMUNITY)
Admission: RE | Admit: 2019-03-04 | Discharge: 2019-03-04 | Disposition: A | Discharge status: Home / Self Care | Payer: BC Managed Care – PPO | Source: Ambulatory Visit | Attending: Family Medicine | Admitting: Family Medicine

## 2019-03-04 DIAGNOSIS — Z1231 Encounter for screening mammogram for malignant neoplasm of breast: Secondary | ICD-10-CM | POA: Diagnosis not present

## 2019-03-07 LAB — NOVEL CORONAVIRUS, NAA: SARS-CoV-2, NAA: NOT DETECTED

## 2019-04-09 ENCOUNTER — Other Ambulatory Visit: Payer: Self-pay

## 2019-04-09 ENCOUNTER — Ambulatory Visit (HOSPITAL_COMMUNITY)
Admission: RE | Admit: 2019-04-09 | Discharge: 2019-04-09 | Disposition: A | Discharge status: Home / Self Care | Payer: BC Managed Care – PPO | Source: Ambulatory Visit | Attending: Family Medicine | Admitting: Family Medicine

## 2019-04-09 ENCOUNTER — Other Ambulatory Visit (HOSPITAL_COMMUNITY): Payer: Self-pay | Admitting: Family Medicine

## 2019-04-09 DIAGNOSIS — R600 Localized edema: Secondary | ICD-10-CM | POA: Insufficient documentation

## 2019-04-09 DIAGNOSIS — M79605 Pain in left leg: Secondary | ICD-10-CM

## 2019-04-11 ENCOUNTER — Telehealth: Payer: Self-pay | Admitting: *Deleted

## 2019-04-11 DIAGNOSIS — R600 Localized edema: Secondary | ICD-10-CM

## 2019-04-11 NOTE — Telephone Encounter (Signed)
Dr. Oneida Alar received a call from Dr. Iona Beard at the [facility] regarding this patient's DVT study done at [facility]. The patient has been having edema in her leg and the DVT scan was negative. Per Dr. Oneida Alar we will schedule this patient for office visit and a caval iliac venous scan to rule out May-Thurner syndrome. I will get our appt staff to call the patient and arrange this for the next 3-4 weeks.

## 2019-05-21 ENCOUNTER — Encounter (INDEPENDENT_AMBULATORY_CARE_PROVIDER_SITE_OTHER): Payer: Self-pay | Admitting: Vascular Surgery

## 2019-05-21 ENCOUNTER — Other Ambulatory Visit: Payer: Self-pay

## 2019-05-21 ENCOUNTER — Ambulatory Visit (INDEPENDENT_AMBULATORY_CARE_PROVIDER_SITE_OTHER): Payer: BC Managed Care – PPO | Admitting: Vascular Surgery

## 2019-05-21 DIAGNOSIS — M7989 Other specified soft tissue disorders: Secondary | ICD-10-CM | POA: Diagnosis not present

## 2019-05-21 DIAGNOSIS — I10 Essential (primary) hypertension: Secondary | ICD-10-CM

## 2019-05-21 DIAGNOSIS — M7122 Synovial cyst of popliteal space [Baker], left knee: Secondary | ICD-10-CM | POA: Diagnosis not present

## 2019-05-21 NOTE — Progress Notes (Signed)
Patient ID: Jodi Duran, female   DOB: 12-11-1955, 63 y.o.   MRN: [id number]  Chief Complaint  Patient presents with  . New Patient (Initial Visit)    ref Hill left leg pain,edema,and possible baker cyst    HPI Jodi Duran is a 63 y.o. female.  I am asked to see the patient by Dr. Berdine Addison for evaluation of leg pain and swelling.  The patient reports having a fall a few months ago, and since that time noticing increasing pain in her left leg associated with worsening swelling.  The swelling has been persistent and it affects her lower leg up to her knee.  She has tried stockings with minimal relief.  She has tried to elevate her legs without much improvement.  She really has no right leg symptoms.  She had a basic ultrasound done about 6 weeks ago which showed no DVT in the left leg but no mention was made of reflux or obstruction.  A Baker's cyst was noted in the left popliteal space and was fairly sizable.  She has no previous history of DVT or superficial thrombophlebitis.     Past Medical History:  Diagnosis Date  . Arthritis   . Hypertension     Past Surgical History:  Procedure Laterality Date  . COLONOSCOPY  06/21/2007   [id number] internal hemorrhoids/Large amount of liquid stool seen in the transverse colon and descending colon which contained particulate matter  . COLONOSCOPY N/A 07/11/2014   Procedure: COLONOSCOPY;  Surgeon: Danie Binder, MD;  Location: [facility];  Service: Endoscopy;  Laterality: N/A;  10:30  - moved to 10:45 - Ginger to notify pt    Family History Family History  Problem Relation Age of Onset  . Diabetes Mother   . Congestive Heart Failure Mother   no bleeding or clotting disorders No aneurysms  Social History Social History   Tobacco Use  . Smoking status: Former Smoker    Packs/day: 0.25    Years: 15.00    Pack years: 3.75    Types: Cigarettes  . Smokeless tobacco: Never Used  . Tobacco comment:  5 cigarettes daily   Substance Use Topics  . Alcohol use: Yes    Comment: Socially  . Drug use: No     No Known Allergies  Current Outpatient Medications  Medication Sig Dispense Refill  . hydrochlorothiazide (HYDRODIURIL) 25 MG tablet Take 25 mg by mouth daily.    . predniSONE (DELTASONE) 5 MG tablet Take 5 mg by mouth daily with breakfast.    . traMADol (ULTRAM) 50 MG tablet TAKE 1 TABLET BY MOUTH IN THE MORNING AND AFTER SUPPER    . celecoxib (CELEBREX) 100 MG capsule TAKE 1 CAPSULE BY MOUTH TWICE DAILY FOR JOINT PAIN    . Conj Estrogens-Bazedoxifene (DUAVEE) 0.45-20 MG TABS Take 1 tablet by mouth daily.    . cyclobenzaprine (FLEXERIL) 5 MG tablet Take 5 mg by mouth 3 (three) times daily as needed for muscle spasms.    . hydrochlorothiazide (HYDRODIURIL) 25 MG tablet Take 25 mg by mouth daily.    Marland Kitchen ibuprofen (ADVIL) 800 MG tablet Take 800 mg by mouth every 8 (eight) hours as needed.    . naproxen (NAPROSYN) 500 MG tablet Take 500 mg by mouth 2 (two) times daily.    . Naproxen-Esomeprazole (VIMOVO) 500-20 MG TBEC Take by mouth.     No current facility-administered medications for this visit.       REVIEW OF SYSTEMS (Negative  unless checked)  Constitutional: [] Weight loss  [] Fever  [] Chills Cardiac: [] Chest pain   [] Chest pressure   [] Palpitations   [] Shortness of breath when laying flat   [] Shortness of breath at rest   [] Shortness of breath with exertion. Vascular:  [] Pain in legs with walking   [] Pain in legs at rest   [] Pain in legs when laying flat   [] Claudication   [] Pain in feet when walking  [] Pain in feet at rest  [] Pain in feet when laying flat   [] History of DVT   [] Phlebitis   [x] Swelling in legs   [] Varicose veins   [] Non-healing ulcers Pulmonary:   [] Uses home oxygen   [] Productive cough   [] Hemoptysis   [] Wheeze  [] COPD   [] Asthma Neurologic:  [] Dizziness  [] Blackouts   [] Seizures   [] History of stroke   [] History of TIA  [] Aphasia   [] Temporary blindness   [] Dysphagia   [] Weakness or  numbness in arms   [] Weakness or numbness in legs Musculoskeletal:  [x] Arthritis   [] Joint swelling   [] Joint pain   [] Low back pain Hematologic:  [] Easy bruising  [] Easy bleeding   [] Hypercoagulable state   [] Anemic  [] Hepatitis Gastrointestinal:  [] Blood in stool   [] Vomiting blood  [] Gastroesophageal reflux/heartburn   [] Abdominal pain Genitourinary:  [] Chronic kidney disease   [] Difficult urination  [] Frequent urination  [] Burning with urination   [] Hematuria Skin:  [] Rashes   [] Ulcers   [] Wounds Psychological:  [] History of anxiety   []  History of major depression.    Physical Exam BP 120/77 (BP Location: Right Arm)   Pulse 73   Resp 16   Ht 5\' 8"  (1.727 m)   Wt 259 lb (117.5 kg)   BMI 39.38 kg/m  Gen:  WD/WN, NAD.  Obese Head: [place]/AT, No temporalis wasting. Ear/Nose/Throat: Hearing grossly intact, nares w/o erythema or drainage, oropharynx w/o Erythema/Exudate Eyes: Conjunctiva clear, sclera non-icteric  Neck: trachea midline.  No JVD.  Pulmonary:  Good air movement, respirations not labored, no use of accessory muscles  Cardiac: RRR, no JVD Vascular:  Vessel Right Left  Radial Palpable Palpable                          DP 2+ 2+  PT 2+ 1+   Gastrointestinal:. No masses, surgical incisions, or scars. Musculoskeletal: M/S 5/5 throughout.  Extremities without ischemic changes.  No deformity or atrophy. 1+ LLE edema. Neurologic: Sensation grossly intact in extremities.  Symmetrical.  Speech is fluent. Motor exam as listed above. Psychiatric: Judgment intact, Mood & affect appropriate for pt's clinical situation. Dermatologic: No rashes or ulcers noted.  No cellulitis or open wounds.    Radiology No results found.  Labs Recent Results (from the past 2160 hour(s))  Novel Coronavirus, NAA ([facility])     Status: None   Collection Time: 02/28/19  1:27 PM   Specimen: Nasal Swab  Result Value Ref Range   SARS-CoV-2, NAA Not Detected Not Detected    Comment: This test  was developed and its performance characteristics determined by [employer]. This test has not been FDA cleared or approved. This test has been authorized by FDA under an Emergency Use Authorization (EUA). This test is only authorized for the duration of time the declaration that circumstances exist justifying the authorization of the emergency use of in vitro diagnostic tests for detection of SARS-CoV-2 virus and/or diagnosis of COVID-19 infection under section 564(b)(1) of the Act, 21 U.S.C. [id number]), unless the authorization  is terminated or revoked sooner. When diagnostic testing is negative, the possibility of a false negative result should be considered in the context of a patient's recent exposures and the presence of clinical signs and symptoms consistent with COVID-19. An individual without symptoms of COVID-19 and who is not shedding SARS-CoV-2 virus would expect to have a negative (not detected) result in this assay.     Assessment/Plan:  Essential hypertension blood pressure control important in reducing the progression of atherosclerotic disease. On appropriate oral medications.   Baker's cyst of knee, left She has a pretty good size left Baker's cyst which is clearly a contributing factor to her left leg pain and swelling.  I suspect that this happened somewhat after her fall and has been exacerbated by issues with her knee that are ongoing.  I recommend she try some anti-inflammatories and elevating the leg.  We are going to do a venous work-up but if that does not improve her symptoms, we may have to consider referral to an orthopedist for further evaluation.  Swelling of limb I have had a long discussion with the patient regarding swelling and why it  causes symptoms.  Patient will begin wearing graduated compression stockings class 1 (20-30 mmHg) on a daily basis a prescription was given. The patient will  beginning wearing the stockings first thing in the  morning and removing them in the evening. The patient is instructed specifically not to sleep in the stockings.   In addition, behavioral modification will be initiated.  This will include frequent elevation, use of over the counter pain medications and exercise such as walking.  I have reviewed systemic causes for chronic edema such as liver, kidney and cardiac etiologies.  The patient denies problems with these organ systems.    Consideration for a lymph pump will also be made based upon the effectiveness of conservative therapy.  This would help to improve the edema control and prevent sequela such as ulcers and infections   Patient should undergo duplex ultrasound of the venous system to ensure that DVT or reflux is not present.  The patient will follow-up with me after the ultrasound.        Leotis Pain 05/21/2019, 10:00 AM   This note was created with Dragon medical transcription system.  Any errors from dictation are unintentional.

## 2019-05-21 NOTE — Patient Instructions (Signed)

## 2019-05-21 NOTE — Assessment & Plan Note (Signed)
blood pressure control important in reducing the progression of atherosclerotic disease. On appropriate oral medications.  

## 2019-05-21 NOTE — Assessment & Plan Note (Signed)

## 2019-05-21 NOTE — Assessment & Plan Note (Signed)
She has a pretty good size left Baker's cyst which is clearly a contributing factor to her left leg pain and swelling.  I suspect that this happened somewhat after her fall and has been exacerbated by issues with her knee that are ongoing.  I recommend she try some anti-inflammatories and elevating the leg.  We are going to do a venous work-up but if that does not improve her symptoms, we may have to consider referral to an orthopedist for further evaluation.

## 2019-05-29 ENCOUNTER — Ambulatory Visit (INDEPENDENT_AMBULATORY_CARE_PROVIDER_SITE_OTHER): Payer: BC Managed Care – PPO | Admitting: Nurse Practitioner

## 2019-05-29 ENCOUNTER — Ambulatory Visit (INDEPENDENT_AMBULATORY_CARE_PROVIDER_SITE_OTHER): Payer: BC Managed Care – PPO

## 2019-05-29 ENCOUNTER — Encounter (INDEPENDENT_AMBULATORY_CARE_PROVIDER_SITE_OTHER): Payer: BC Managed Care – PPO

## 2019-05-29 ENCOUNTER — Other Ambulatory Visit: Payer: Self-pay

## 2019-05-29 ENCOUNTER — Encounter (INDEPENDENT_AMBULATORY_CARE_PROVIDER_SITE_OTHER): Payer: Self-pay | Admitting: Nurse Practitioner

## 2019-05-29 ENCOUNTER — Encounter (INDEPENDENT_AMBULATORY_CARE_PROVIDER_SITE_OTHER): Payer: Self-pay

## 2019-05-29 VITALS — BP 132/83 | HR 70 | Resp 16 | Wt 255.8 lb

## 2019-05-29 DIAGNOSIS — R600 Localized edema: Secondary | ICD-10-CM

## 2019-05-29 DIAGNOSIS — I10 Essential (primary) hypertension: Secondary | ICD-10-CM | POA: Diagnosis not present

## 2019-05-29 DIAGNOSIS — I83812 Varicose veins of left lower extremities with pain: Secondary | ICD-10-CM | POA: Diagnosis not present

## 2019-05-29 DIAGNOSIS — M7122 Synovial cyst of popliteal space [Baker], left knee: Secondary | ICD-10-CM

## 2019-05-29 DIAGNOSIS — M7989 Other specified soft tissue disorders: Secondary | ICD-10-CM

## 2019-05-30 ENCOUNTER — Ambulatory Visit: Payer: BC Managed Care – PPO | Admitting: Vascular Surgery

## 2019-05-30 ENCOUNTER — Ambulatory Visit (HOSPITAL_COMMUNITY): Payer: BC Managed Care – PPO

## 2019-06-06 ENCOUNTER — Encounter (INDEPENDENT_AMBULATORY_CARE_PROVIDER_SITE_OTHER): Payer: Self-pay | Admitting: Nurse Practitioner

## 2019-06-06 NOTE — Progress Notes (Signed)
SUBJECTIVE:  Patient ID: Jodi Duran, female    DOB: October 02, 1955, 63 y.o.   MRN: [id number] Chief Complaint  Patient presents with  . Follow-up    ultrasound follow up    HPI  Jodi Duran is a 63 y.o. female that presents today for evaluation of leg pain and swelling.  The patient had a fall a few months ago and since that time she has been having increasing pain in her left leg with worse swelling.  The swelling is persistent and it affects her calf area up to her knee.  She has been wearing medical grade 1 compression stockings but it does not provide relief.  She is trying to elevate her legs as much as possible but that too provides little improvement.  The right leg is generally not affected.  About 6 weeks ago she had an ultrasound which revealed no evidence of DVT however a Baker's cyst was noted that was fairly sizable.  Since her last visit, the patient has been alternating tramadol and Tylenol and has noted that the swelling seems to have decreased and the pain is also decreased.  The patient denies any fever, chills, nausea, vomiting or diarrhea.  Today the patient underwent a lower extremity venous reflux study which revealed no evidence of DVT or superficial venous thrombosis.  The patient does have reflux present within the common femoral vein and great saphenous vein of the left lower extremity.  The patient has spontaneous and phasic flow throughout the leg which would likely rule out May Thurner syndrome.  The patient also has a fluid collection in the posterior calf measuring 5.78 cm x 0.92 cm x 3.13 cm which appears to have decreased in size compared to the previous exam on 04/09/2019.  Past Medical History:  Diagnosis Date  . Arthritis   . Hypertension     Past Surgical History:  Procedure Laterality Date  . COLONOSCOPY  06/21/2007   [id number] internal hemorrhoids/Large amount of liquid stool seen in the transverse colon and descending colon which contained  particulate matter  . COLONOSCOPY N/A 07/11/2014   Procedure: COLONOSCOPY;  Surgeon: Danie Binder, MD;  Location: [facility];  Service: Endoscopy;  Laterality: N/A;  10:30  - moved to 10:45 - Ginger to notify pt    Social History   Socioeconomic History  . Marital status: Single    Spouse name: Not on file  . Number of children: Not on file  . Years of education: Not on file  . Highest education level: Not on file  Occupational History  . Not on file  Social Needs  . Financial resource strain: Not on file  . Food insecurity    Worry: Not on file    Inability: Not on file  . Transportation needs    Medical: Not on file    Non-medical: Not on file  Tobacco Use  . Smoking status: Former Smoker    Packs/day: 0.25    Years: 15.00    Pack years: 3.75    Types: Cigarettes  . Smokeless tobacco: Never Used  . Tobacco comment:  5 cigarettes daily  Substance and Sexual Activity  . Alcohol use: Yes    Comment: Socially  . Drug use: No  . Sexual activity: Not on file  Lifestyle  . Physical activity    Days per week: Not on file    Minutes per session: Not on file  . Stress: Not on file  Relationships  . Social  connections    Talks on phone: Not on file    Gets together: Not on file    Attends religious service: Not on file    Active member of club or organization: Not on file    Attends meetings of clubs or organizations: Not on file    Relationship status: Not on file  . Intimate partner violence    Fear of current or ex partner: Not on file    Emotionally abused: Not on file    Physically abused: Not on file    Forced sexual activity: Not on file  Other Topics Concern  . Not on file  Social History Narrative  . Not on file    Family History  Problem Relation Age of Onset  . Diabetes Mother   . Congestive Heart Failure Mother     No Known Allergies   Review of Systems   Review of Systems: Negative Unless Checked Constitutional: [] Weight loss  [] Fever   [] Chills Cardiac: [] Chest pain   []  Atrial Fibrillation  [] Palpitations   [] Shortness of breath when laying flat   [] Shortness of breath with exertion. [] Shortness of breath at rest Vascular:  [] Pain in legs with walking   [] Pain in legs with standing [] Pain in legs when laying flat   [] Claudication    [] Pain in feet when laying flat    [] History of DVT   [] Phlebitis   [x] Swelling in legs   [x] Varicose veins   [] Non-healing ulcers Pulmonary:   [] Uses home oxygen   [] Productive cough   [] Hemoptysis   [] Wheeze  [] COPD   [] Asthma Neurologic:  [] Dizziness   [] Seizures  [] Blackouts [] History of stroke   [] History of TIA  [] Aphasia   [] Temporary Blindness   [] Weakness or numbness in arm   [] Weakness or numbness in leg Musculoskeletal:   [x] Joint swelling   [x] Joint pain   [] Low back pain  []  History of Knee Replacement [] Arthritis [] back Surgeries  []  Spinal Stenosis    Hematologic:  [] Easy bruising  [] Easy bleeding   [] Hypercoagulable state   [] Anemic Gastrointestinal:  [] Diarrhea   [] Vomiting  [] Gastroesophageal reflux/heartburn   [] Difficulty swallowing. [] Abdominal pain Genitourinary:  [] Chronic kidney disease   [] Difficult urination  [] Anuric   [] Blood in urine [] Frequent urination  [] Burning with urination   [] Hematuria Skin:  [] Rashes   [] Ulcers [] Wounds Psychological:  [] History of anxiety   []  History of major depression  []  Memory Difficulties      OBJECTIVE:   Physical Exam  BP 132/83 (BP Location: Right Arm)   Pulse 70   Resp 16   Wt 255 lb 12.8 oz (116 kg)   BMI 38.89 kg/m   Gen: WD/WN, NAD Head: [place]/AT, No temporalis wasting.  Ear/Nose/Throat: Hearing grossly intact, nares w/o erythema or drainage Eyes: PER, EOMI, sclera nonicteric.  Neck: Supple, no masses.  No JVD.  Pulmonary:  Good air movement, no use of accessory muscles.  Cardiac: RRR Vascular: scattered varicosities present bilaterally.  Mild venous stasis changes to the legs bilaterally.  3+ soft edema left lower  extremity, minimal edema on right  Vessel Right Left  Radial Palpable Palpable  Dorsalis Pedis Palpable Palpable  Posterior Tibial Palpable Palpable   Gastrointestinal: soft, non-distended. No guarding/no peritoneal signs.  Musculoskeletal: M/S 5/5 throughout.  No deformity or atrophy.  Neurologic: Pain and light touch intact in extremities.  Symmetrical.  Speech is fluent. Motor exam as listed above. Psychiatric: Judgment intact, Mood & affect appropriate for pt's clinical situation. Dermatologic: No Venous  rashes. No Ulcers Noted.  No changes consistent with cellulitis. Lymph : No Cervical lymphadenopathy, no lichenification or skin changes of chronic lymphedema.       ASSESSMENT AND PLAN:  1. Varicose veins of left lower extremity with pain  Recommend:  The patient has large symptomatic varicose veins that are painful and associated with swelling.  I have had a long discussion with the patient regarding  varicose veins and why they cause symptoms.  Patient will begin wearing graduated compression stockings class 1 on a daily basis, beginning first thing in the morning and removing them in the evening. The patient is instructed specifically not to sleep in the stockings.    The patient  will also begin using over-the-counter analgesics such as Motrin 600 mg po TID to help control the symptoms.    In addition, behavioral modification including elevation during the day will be initiated.     Further plans will be based on the ultrasound results and whether conservative therapies are successful at eliminating the pain and swelling.   2. Essential hypertension Continue antihypertensive medications as already ordered, these medications have been reviewed and there are no changes at this time.   3. Baker's cyst of knee, left The pain that the patient had has started to subside which may be consistent with a ruptured Baker's cyst that is beginning to dissipate.  Based on the location  of the fluid and swelling there is also thought that it could be a resolving hematoma after the patient's trauma as well.  Patient is advised to continue alternating tramadol and Tylenol with warm compresses.   Current Outpatient Medications on File Prior to Visit  Medication Sig Dispense Refill  . celecoxib (CELEBREX) 100 MG capsule TAKE 1 CAPSULE BY MOUTH TWICE DAILY FOR JOINT PAIN    . Conj Estrogens-Bazedoxifene (DUAVEE) 0.45-20 MG TABS Take 1 tablet by mouth daily.    . cyclobenzaprine (FLEXERIL) 5 MG tablet Take 5 mg by mouth 3 (three) times daily as needed for muscle spasms.    . hydrochlorothiazide (HYDRODIURIL) 25 MG tablet Take 25 mg by mouth daily.    . hydrochlorothiazide (HYDRODIURIL) 25 MG tablet Take 25 mg by mouth daily.    Marland Kitchen ibuprofen (ADVIL) 800 MG tablet Take 800 mg by mouth every 8 (eight) hours as needed.    . naproxen (NAPROSYN) 500 MG tablet Take 500 mg by mouth 2 (two) times daily.    . Naproxen-Esomeprazole (VIMOVO) 500-20 MG TBEC Take by mouth.    . predniSONE (DELTASONE) 5 MG tablet Take 5 mg by mouth daily with breakfast.    . traMADol (ULTRAM) 50 MG tablet TAKE 1 TABLET BY MOUTH IN THE MORNING AND AFTER SUPPER     No current facility-administered medications on file prior to visit.     There are no Patient Instructions on file for this visit. No follow-ups on file.   Kris Hartmann, NP  This note was completed with Sales executive.  Any errors are purely unintentional.

## 2019-08-12 ENCOUNTER — Encounter: Payer: Self-pay | Admitting: Orthopedic Surgery

## 2019-08-12 ENCOUNTER — Ambulatory Visit: Payer: BC Managed Care – PPO | Admitting: Orthopedic Surgery

## 2019-08-29 ENCOUNTER — Ambulatory Visit (INDEPENDENT_AMBULATORY_CARE_PROVIDER_SITE_OTHER): Payer: Self-pay | Admitting: Nurse Practitioner

## 2019-10-14 ENCOUNTER — Ambulatory Visit (INDEPENDENT_AMBULATORY_CARE_PROVIDER_SITE_OTHER): Payer: Self-pay | Admitting: Nurse Practitioner

## 2019-10-14 DIAGNOSIS — M25562 Pain in left knee: Secondary | ICD-10-CM | POA: Diagnosis not present

## 2019-10-14 DIAGNOSIS — E785 Hyperlipidemia, unspecified: Secondary | ICD-10-CM | POA: Diagnosis not present

## 2019-10-14 DIAGNOSIS — M25561 Pain in right knee: Secondary | ICD-10-CM | POA: Diagnosis not present

## 2019-10-14 DIAGNOSIS — I10 Essential (primary) hypertension: Secondary | ICD-10-CM | POA: Diagnosis not present

## 2019-10-21 ENCOUNTER — Ambulatory Visit: Payer: PRIVATE HEALTH INSURANCE | Attending: Internal Medicine

## 2019-10-21 ENCOUNTER — Ambulatory Visit (INDEPENDENT_AMBULATORY_CARE_PROVIDER_SITE_OTHER): Payer: Self-pay | Admitting: Nurse Practitioner

## 2019-10-21 ENCOUNTER — Other Ambulatory Visit: Payer: Self-pay

## 2019-10-21 DIAGNOSIS — Z20822 Contact with and (suspected) exposure to covid-19: Secondary | ICD-10-CM

## 2019-10-22 LAB — NOVEL CORONAVIRUS, NAA: SARS-CoV-2, NAA: DETECTED — AB

## 2019-10-23 ENCOUNTER — Other Ambulatory Visit: Payer: Self-pay | Admitting: Pulmonary Disease

## 2019-10-23 ENCOUNTER — Telehealth: Payer: Self-pay | Admitting: Pulmonary Disease

## 2019-10-23 DIAGNOSIS — U071 COVID-19: Secondary | ICD-10-CM

## 2019-10-23 DIAGNOSIS — E669 Obesity, unspecified: Secondary | ICD-10-CM

## 2019-10-23 DIAGNOSIS — I10 Essential (primary) hypertension: Secondary | ICD-10-CM

## 2019-10-23 NOTE — Progress Notes (Signed)
10/23/2019  10/20/19 - Symptom onset  10/21/19 - SarsCOV2 - positive    I connected by phone with Jodi Duran on 10/23/2019 at 8:42 AM to discuss the potential use of an new treatment for mild to moderate COVID-19 viral infection in non-hospitalized patients.  This patient is a 64 y.o. female that meets the FDA criteria for Emergency Use Authorization of bamlanivimab or casirivimab\imdevimab.  Has a (+) direct SARS-CoV-2 viral test result  Has mild or moderate COVID-19   Is ? 63 years of age and weighs ? 40 kg  Is NOT hospitalized due to COVID-19  Is NOT requiring oxygen therapy or requiring an increase in baseline oxygen flow rate due to COVID-19  Is within 10 days of symptom onset  Has at least one of the high risk factor(s) for progression to severe COVID-19 and/or hospitalization as defined in EUA.  Specific high risk criteria : Hypertension   I have spoken and communicated the following to the patient or parent/caregiver:  1. FDA has authorized the emergency use of bamlanivimab and casirivimab\imdevimab for the treatment of mild to moderate COVID-19 in adults and pediatric patients with positive results of direct SARS-CoV-2 viral testing who are 23 years of age and older weighing at least 40 kg, and who are at high risk for progressing to severe COVID-19 and/or hospitalization.  2. The significant known and potential risks and benefits of bamlanivimab and casirivimab\imdevimab, and the extent to which such potential risks and benefits are unknown.  3. Information on available alternative treatments and the risks and benefits of those alternatives, including clinical trials.  4. Patients treated with bamlanivimab and casirivimab\imdevimab should continue to self-isolate and use infection control measures (e.g., wear mask, isolate, social distance, avoid sharing personal items, clean and disinfect "high touch" surfaces, and frequent handwashing) according to CDC guidelines.    5. The patient or parent/caregiver has the option to accept or refuse bamlanivimab or casirivimab\imdevimab .  After reviewing this information with the patient agrees.   A:  64 year old with HTN  BMI - 38.9 10/20/19 - symptom onset - fatigue, lose of smell, occasional fever 10/21/19 - tested - positive   P:  Signed up for 10/24/19 at 1230 Directions given    Jodi Duran 10/23/2019 8:42 AM

## 2019-10-23 NOTE — Telephone Encounter (Signed)
10/23/2019  10/20/19 - Symptom onset  10/21/19 - SarsCOV2 - positive    I connected by phone with Jodi Duran on 10/23/2019 at 8:42 AM to discuss the potential use of an new treatment for mild to moderate COVID-19 viral infection in non-hospitalized patients.  This patient is a 64 y.o. female that meets the FDA criteria for Emergency Use Authorization of bamlanivimab or casirivimab\imdevimab.  Has a (+) direct SARS-CoV-2 viral test result  Has mild or moderate COVID-19   Is ? 64 years of age and weighs ? 40 kg  Is NOT hospitalized due to COVID-19  Is NOT requiring oxygen therapy or requiring an increase in baseline oxygen flow rate due to COVID-19  Is within 10 days of symptom onset  Has at least one of the high risk factor(s) for progression to severe COVID-19 and/or hospitalization as defined in EUA.  Specific high risk criteria : Hypertension   I have spoken and communicated the following to the patient or parent/caregiver:  1. FDA has authorized the emergency use of bamlanivimab and casirivimab\imdevimab for the treatment of mild to moderate COVID-19 in adults and pediatric patients with positive results of direct SARS-CoV-2 viral testing who are 12 years of age and older weighing at least 40 kg, and who are at high risk for progressing to severe COVID-19 and/or hospitalization.  2. The significant known and potential risks and benefits of bamlanivimab and casirivimab\imdevimab, and the extent to which such potential risks and benefits are unknown.  3. Information on available alternative treatments and the risks and benefits of those alternatives, including clinical trials.  4. Patients treated with bamlanivimab and casirivimab\imdevimab should continue to self-isolate and use infection control measures (e.g., wear mask, isolate, social distance, avoid sharing personal items, clean and disinfect "high touch" surfaces, and frequent handwashing) according to CDC guidelines.    5. The patient or parent/caregiver has the option to accept or refuse bamlanivimab or casirivimab\imdevimab .  After reviewing this information with the patient, The patient agreed to proceed with receiving the casirivimab\imdevimab infusion and will be provided a copy of the Fact sheet prior to receiving the infusion..  A:  64 year old with HTN  BMI - 38.9 10/20/19 - symptom onset - fatigue, lose of smell, occasional fever 10/21/19 - tested - positive   P:  Signed up for 10/24/19 at 1230 at [facility] Directions given    Jodi Duran 10/23/2019 8:42 AM

## 2019-10-24 ENCOUNTER — Ambulatory Visit (HOSPITAL_COMMUNITY)
Admission: RE | Admit: 2019-10-24 | Discharge: 2019-10-24 | Disposition: A | Discharge status: Home / Self Care | Payer: HRSA Program | Source: Ambulatory Visit | Attending: Pulmonary Disease | Admitting: Pulmonary Disease

## 2019-10-24 DIAGNOSIS — U071 COVID-19: Secondary | ICD-10-CM | POA: Insufficient documentation

## 2019-10-24 DIAGNOSIS — Z23 Encounter for immunization: Secondary | ICD-10-CM | POA: Insufficient documentation

## 2019-10-24 MED ORDER — SODIUM CHLORIDE 0.9 % IV SOLN
INTRAVENOUS | Status: DC | PRN
  Administered 2019-10-24: 250 mL via INTRAVENOUS

## 2019-10-24 MED ORDER — SODIUM CHLORIDE 0.9 % IV SOLN
Freq: Once | INTRAVENOUS | Status: AC
  Filled 2019-10-24: qty 10

## 2019-10-24 MED ORDER — ALBUTEROL SULFATE HFA 108 (90 BASE) MCG/ACT IN AERS: 2.0000 | INHALATION_SPRAY | Freq: Once | RESPIRATORY_TRACT | Status: DC | PRN

## 2019-10-24 MED ORDER — FAMOTIDINE IN NACL 20-0.9 MG/50ML-% IV SOLN: 20.0000 mg | Freq: Once | INTRAVENOUS | Status: DC | PRN

## 2019-10-24 MED ORDER — EPINEPHRINE 0.3 MG/0.3ML IJ SOAJ: 0.3000 mg | Freq: Once | INTRAMUSCULAR | Status: DC | PRN

## 2019-10-24 MED ORDER — METHYLPREDNISOLONE SODIUM SUCC 125 MG IJ SOLR: 125.0000 mg | Freq: Once | INTRAMUSCULAR | Status: DC | PRN

## 2019-10-24 MED ORDER — DIPHENHYDRAMINE HCL 50 MG/ML IJ SOLN: 50.0000 mg | Freq: Once | INTRAMUSCULAR | Status: DC | PRN

## 2019-10-24 MED ORDER — ACETAMINOPHEN 325 MG PO TABS
650.0000 mg | ORAL_TABLET | Freq: Four times a day (QID) | ORAL | Status: DC | PRN
  Administered 2019-10-24: 650 mg via ORAL
  Filled 2019-10-24: qty 2

## 2019-10-24 NOTE — Discharge Instructions (Signed)

## 2019-10-24 NOTE — Progress Notes (Signed)
  Diagnosis: COVID-19  Physician: Joya Gaskins  Procedure: [facility] Med: casirivimab\imdevimab infusion - Provided patient with casirivimab\imdevimab fact sheet for patients, parents and caregivers prior to infusion.  Complications: No immediate complications noted.  Discharge: Discharged home   Baxter Hire 10/24/2019

## 2019-11-04 ENCOUNTER — Ambulatory Visit: Payer: Self-pay | Admitting: *Deleted

## 2019-11-04 NOTE — Telephone Encounter (Signed)
Patient tested positive for Covid on October 22, 2019 when patient started experiencing bad headache and cough.  Patient received plasma infusion on October 24, 2019. Patient has been quarantining since testing positive.  Now patient's aunt, who lives with her, has tested positive.  Patient wanting to know if she should continue her quarantine along with her aunt since they have not been wearing masks nor social distancing in the home.  The RN instructed patient to continue her quarantine in the home until March 1st along with her aunt.   Reminded patient that one must be fever free without any antipyretic medications to reduce fever for 3 consecutive days before coming off quarantine.    Patient then wanting to know when she should schedule covid vaccine.  Advised patient as per her AVS (from infusion) to wait for 90 days before getting Covid vaccine. Patient verbalized understanding and appreciative of all the information.    Reason for Disposition . Health Information question, no triage required and triager able to answer question  Answer Assessment - Initial Assessment Questions 1. REASON FOR CALL or QUESTION: "What is your reason for calling today?" or "How can I best help you?" or "What question do you have that I can help answer?"     Patient had multiple questions about testing positive for Covid and length of time to quarantine;  another person in the household has tested positive and should I continue quarantining with my aunt, as we have not been wearing masks nor social distancing; how long to wait until I get my vaccine after having had plasma infusion for the treatment of Covid.  Protocols used: INFORMATION ONLY CALL - NO TRIAGE-A-AH

## 2019-11-18 DIAGNOSIS — Z6835 Body mass index (BMI) 35.0-35.9, adult: Secondary | ICD-10-CM | POA: Diagnosis not present

## 2019-11-18 DIAGNOSIS — M170 Bilateral primary osteoarthritis of knee: Secondary | ICD-10-CM | POA: Diagnosis not present

## 2019-11-18 DIAGNOSIS — M25561 Pain in right knee: Secondary | ICD-10-CM | POA: Diagnosis not present

## 2019-11-18 DIAGNOSIS — M25562 Pain in left knee: Secondary | ICD-10-CM | POA: Diagnosis not present

## 2019-12-30 DIAGNOSIS — M170 Bilateral primary osteoarthritis of knee: Secondary | ICD-10-CM | POA: Diagnosis not present

## 2020-01-27 DIAGNOSIS — Z0001 Encounter for general adult medical examination with abnormal findings: Secondary | ICD-10-CM | POA: Diagnosis not present

## 2020-01-27 DIAGNOSIS — I10 Essential (primary) hypertension: Secondary | ICD-10-CM | POA: Diagnosis not present

## 2020-01-27 DIAGNOSIS — E785 Hyperlipidemia, unspecified: Secondary | ICD-10-CM | POA: Diagnosis not present

## 2020-01-27 DIAGNOSIS — E789 Disorder of lipoprotein metabolism, unspecified: Secondary | ICD-10-CM | POA: Diagnosis not present

## 2020-01-27 DIAGNOSIS — Z0000 Encounter for general adult medical examination without abnormal findings: Secondary | ICD-10-CM | POA: Diagnosis not present

## 2020-02-03 ENCOUNTER — Encounter: Payer: Self-pay | Admitting: Orthopedic Surgery

## 2020-02-05 ENCOUNTER — Ambulatory Visit: Payer: BLUE CROSS/BLUE SHIELD | Admitting: Orthopedic Surgery

## 2020-03-02 DIAGNOSIS — R634 Abnormal weight loss: Secondary | ICD-10-CM | POA: Diagnosis not present

## 2020-03-02 DIAGNOSIS — M170 Bilateral primary osteoarthritis of knee: Secondary | ICD-10-CM | POA: Diagnosis not present

## 2020-03-02 DIAGNOSIS — I10 Essential (primary) hypertension: Secondary | ICD-10-CM | POA: Diagnosis not present

## 2020-03-02 DIAGNOSIS — E876 Hypokalemia: Secondary | ICD-10-CM | POA: Diagnosis not present

## 2020-03-02 DIAGNOSIS — M25562 Pain in left knee: Secondary | ICD-10-CM | POA: Diagnosis not present

## 2020-03-02 DIAGNOSIS — E785 Hyperlipidemia, unspecified: Secondary | ICD-10-CM | POA: Diagnosis not present

## 2020-03-02 DIAGNOSIS — M25561 Pain in right knee: Secondary | ICD-10-CM | POA: Diagnosis not present

## 2020-03-17 DIAGNOSIS — M2550 Pain in unspecified joint: Secondary | ICD-10-CM | POA: Diagnosis not present

## 2020-04-07 ENCOUNTER — Other Ambulatory Visit (HOSPITAL_COMMUNITY): Payer: Self-pay | Admitting: Family Medicine

## 2020-04-07 DIAGNOSIS — Z1231 Encounter for screening mammogram for malignant neoplasm of breast: Secondary | ICD-10-CM

## 2020-04-13 ENCOUNTER — Ambulatory Visit (HOSPITAL_COMMUNITY)
Admission: RE | Admit: 2020-04-13 | Discharge: 2020-04-13 | Disposition: A | Discharge status: Home / Self Care | Payer: BC Managed Care – PPO | Source: Ambulatory Visit | Attending: Family Medicine | Admitting: Family Medicine

## 2020-04-13 ENCOUNTER — Other Ambulatory Visit: Payer: Self-pay

## 2020-04-13 DIAGNOSIS — Z1231 Encounter for screening mammogram for malignant neoplasm of breast: Secondary | ICD-10-CM

## 2020-04-20 DIAGNOSIS — M2550 Pain in unspecified joint: Secondary | ICD-10-CM | POA: Diagnosis not present

## 2020-04-20 DIAGNOSIS — M0579 Rheumatoid arthritis with rheumatoid factor of multiple sites without organ or systems involvement: Secondary | ICD-10-CM | POA: Diagnosis not present

## 2020-04-20 DIAGNOSIS — M150 Primary generalized (osteo)arthritis: Secondary | ICD-10-CM | POA: Diagnosis not present

## 2020-05-26 DIAGNOSIS — E785 Hyperlipidemia, unspecified: Secondary | ICD-10-CM | POA: Diagnosis not present

## 2020-05-26 DIAGNOSIS — M170 Bilateral primary osteoarthritis of knee: Secondary | ICD-10-CM | POA: Diagnosis not present

## 2020-05-26 DIAGNOSIS — I10 Essential (primary) hypertension: Secondary | ICD-10-CM | POA: Diagnosis not present

## 2020-05-26 DIAGNOSIS — Z23 Encounter for immunization: Secondary | ICD-10-CM | POA: Diagnosis not present

## 2020-05-26 DIAGNOSIS — D649 Anemia, unspecified: Secondary | ICD-10-CM | POA: Diagnosis not present

## 2020-06-08 DIAGNOSIS — M150 Primary generalized (osteo)arthritis: Secondary | ICD-10-CM | POA: Diagnosis not present

## 2020-06-08 DIAGNOSIS — M2550 Pain in unspecified joint: Secondary | ICD-10-CM | POA: Diagnosis not present

## 2020-06-08 DIAGNOSIS — M0579 Rheumatoid arthritis with rheumatoid factor of multiple sites without organ or systems involvement: Secondary | ICD-10-CM | POA: Diagnosis not present

## 2020-09-26 DIAGNOSIS — E785 Hyperlipidemia, unspecified: Secondary | ICD-10-CM | POA: Diagnosis not present

## 2020-09-26 DIAGNOSIS — I10 Essential (primary) hypertension: Secondary | ICD-10-CM | POA: Diagnosis not present

## 2020-09-26 DIAGNOSIS — M0579 Rheumatoid arthritis with rheumatoid factor of multiple sites without organ or systems involvement: Secondary | ICD-10-CM | POA: Diagnosis not present

## 2020-09-26 DIAGNOSIS — M170 Bilateral primary osteoarthritis of knee: Secondary | ICD-10-CM | POA: Diagnosis not present

## 2020-09-29 DIAGNOSIS — E785 Hyperlipidemia, unspecified: Secondary | ICD-10-CM | POA: Diagnosis not present

## 2020-09-29 DIAGNOSIS — I10 Essential (primary) hypertension: Secondary | ICD-10-CM | POA: Diagnosis not present

## 2020-10-21 DIAGNOSIS — Z6838 Body mass index (BMI) 38.0-38.9, adult: Secondary | ICD-10-CM | POA: Diagnosis not present

## 2020-10-21 DIAGNOSIS — Z01419 Encounter for gynecological examination (general) (routine) without abnormal findings: Secondary | ICD-10-CM | POA: Diagnosis not present

## 2020-11-23 IMAGING — MG DIGITAL SCREENING BILAT W/ TOMO W/ CAD
6 of 10 series · 6 of 30 positions shown · non-contrast
Comparison: Previous exam(s).

CLINICAL DATA: Screening.

EXAM:
DIGITAL SCREENING BILATERAL MAMMOGRAM WITH TOMO AND CAD

[L CC synth-2D]
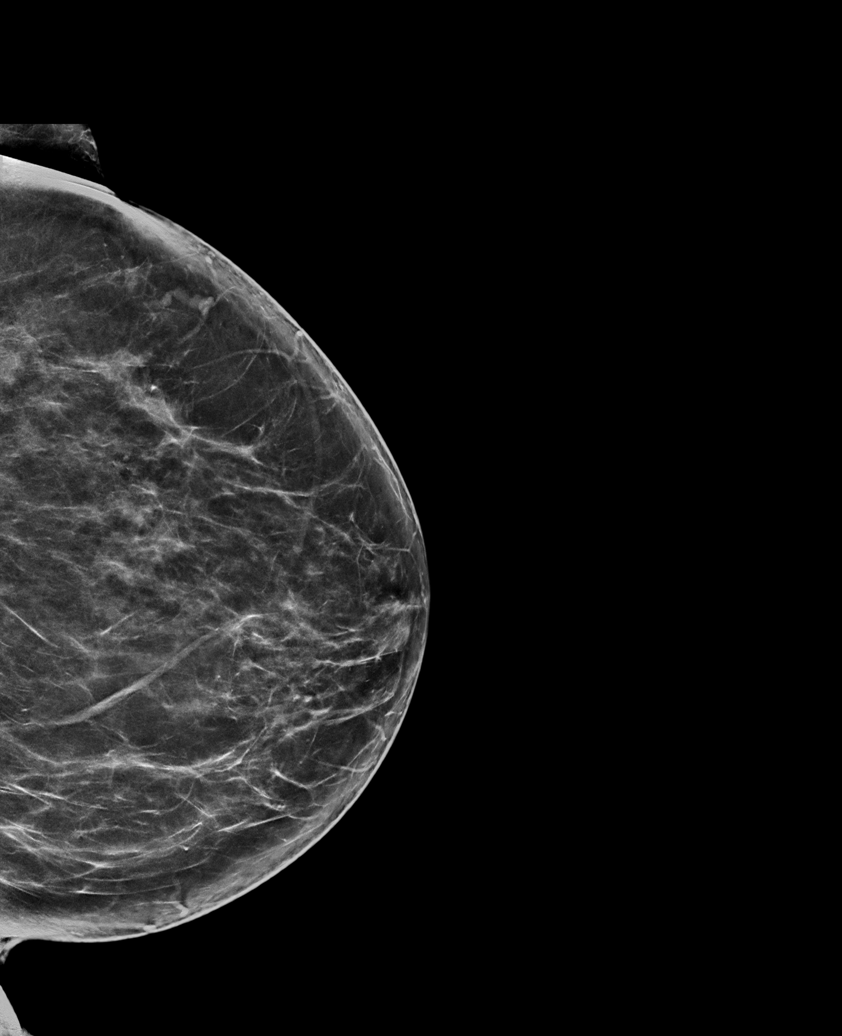

[L MLO synth-2D (1 of 2)]
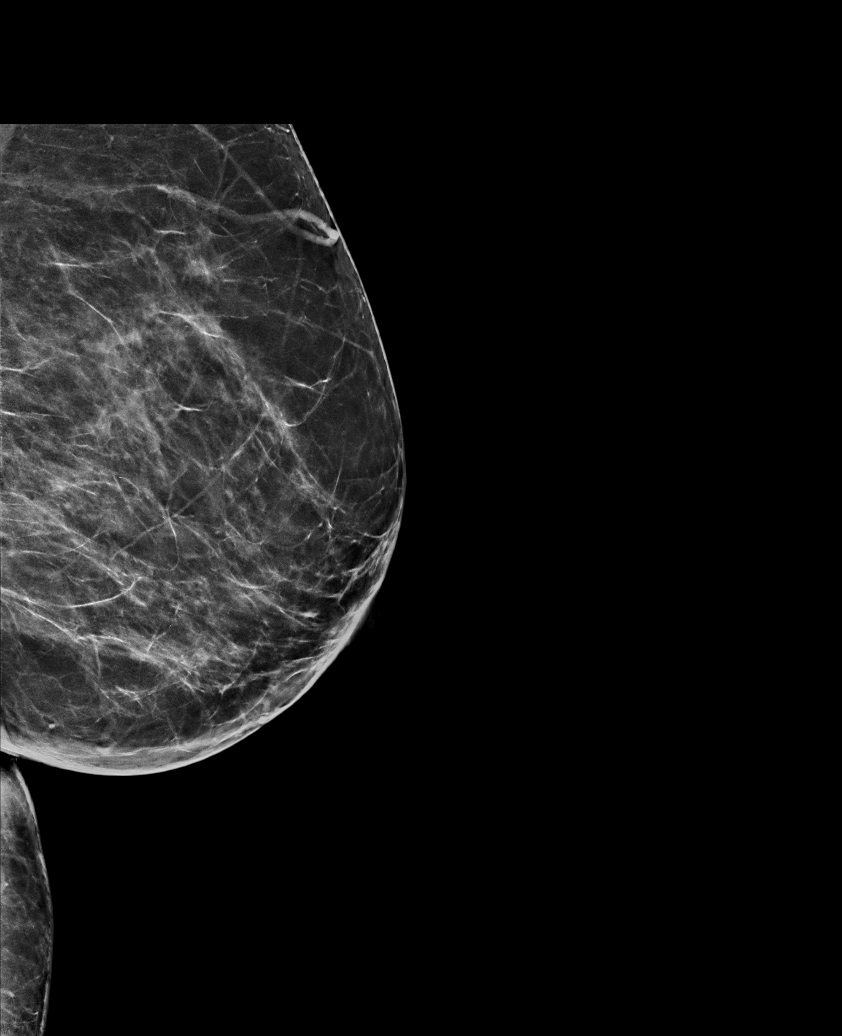

[R CC synth-2D]
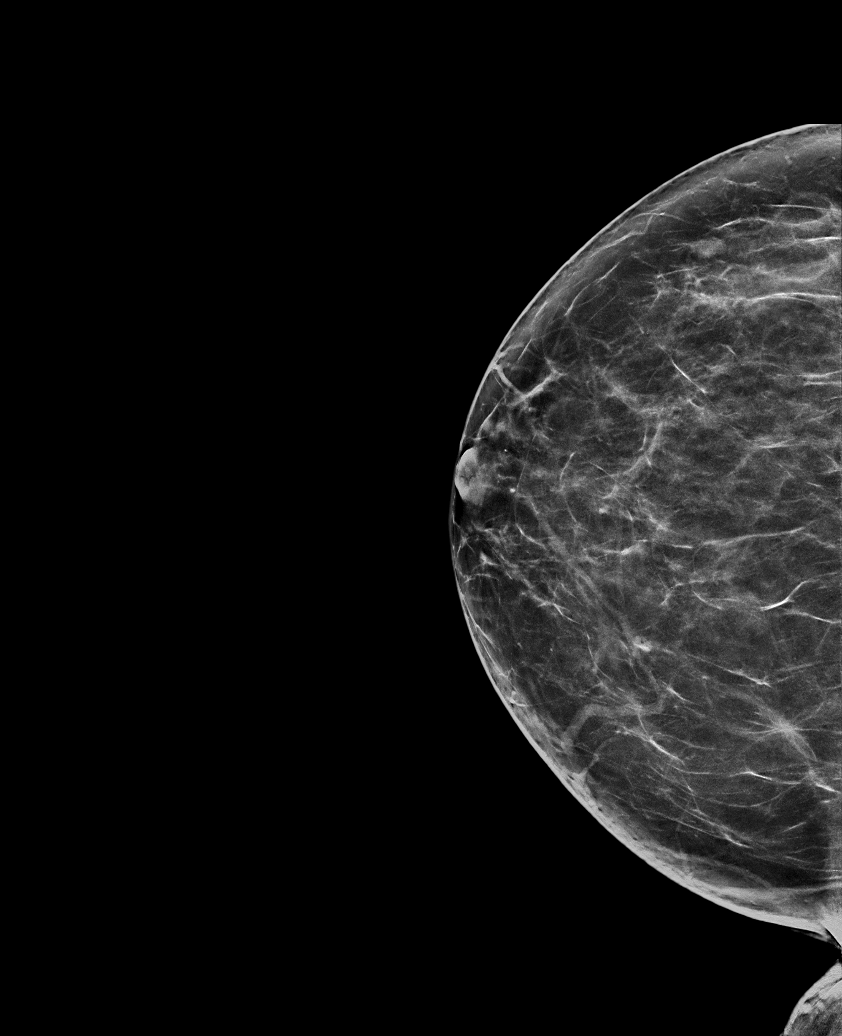

[R MLO synth-2D]
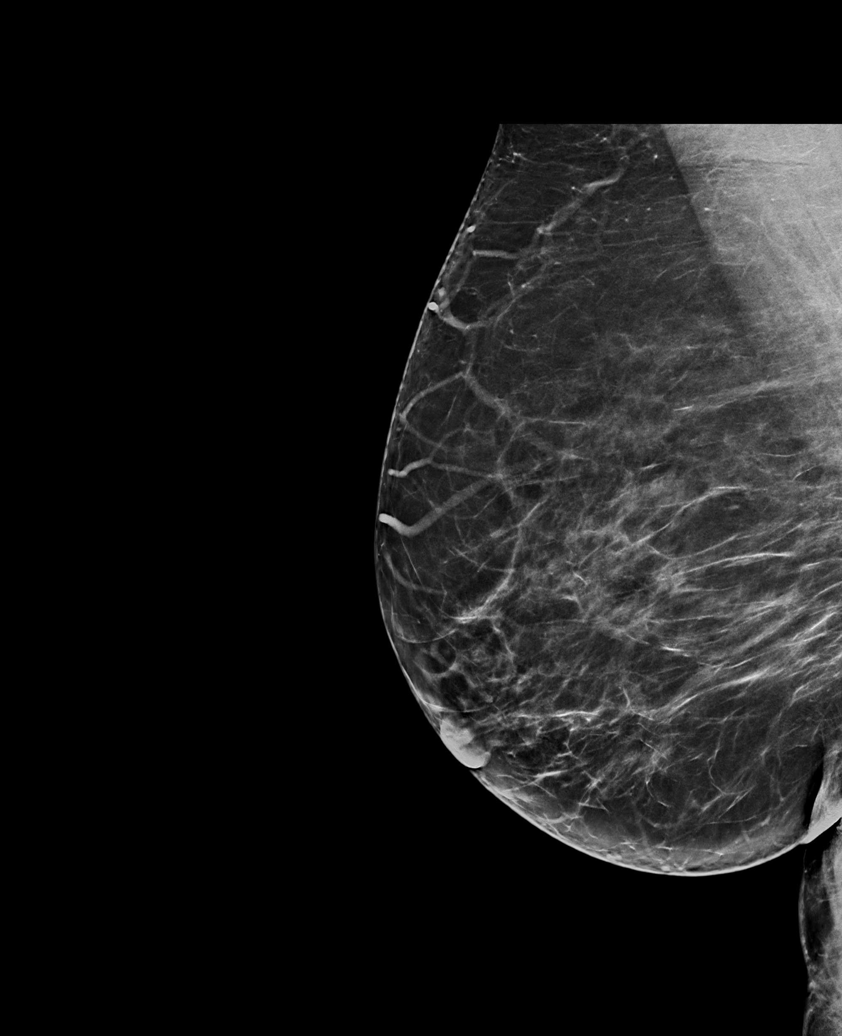

[L MLO synth-2D (2 of 2)]
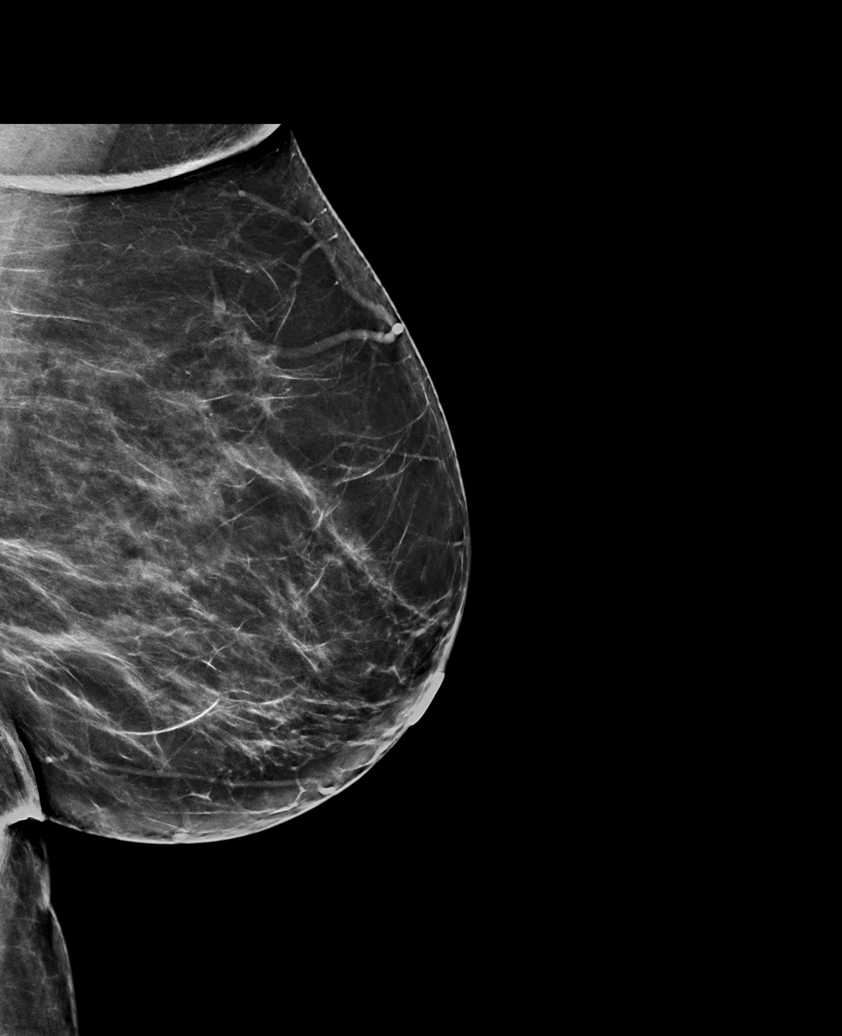

[L CC tomo · tomo slice 39/77.0]
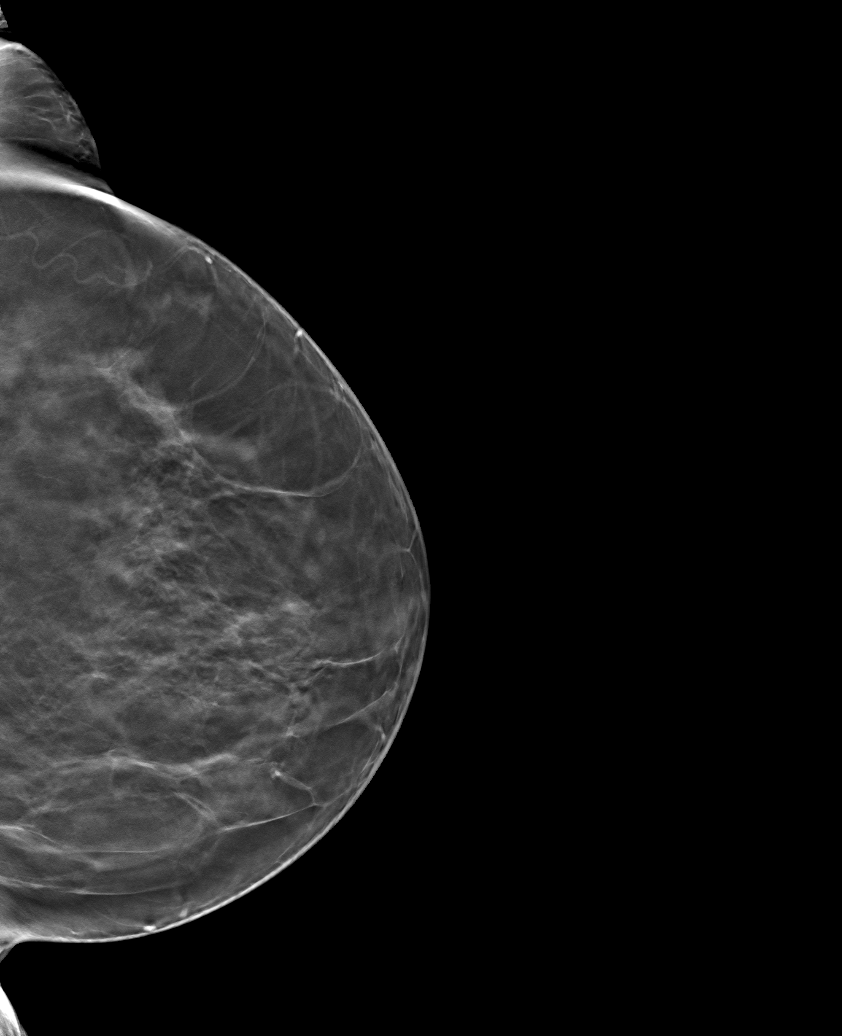

[6 of 30 positions shown; findings below may reference images not displayed]

ACR Breast Density Category b: There are scattered areas of
fibroglandular density.
FINDINGS: There are no findings suspicious for malignancy. Images were
processed with CAD.
IMPRESSION: No mammographic evidence of malignancy. A result letter of this
screening mammogram will be mailed directly to the patient.

RECOMMENDATION:
Screening mammogram in one year. (Code:[id number])

BI-RADS CATEGORY  1: Negative.

## 2021-01-04 DIAGNOSIS — M150 Primary generalized (osteo)arthritis: Secondary | ICD-10-CM | POA: Diagnosis not present

## 2021-01-04 DIAGNOSIS — M0579 Rheumatoid arthritis with rheumatoid factor of multiple sites without organ or systems involvement: Secondary | ICD-10-CM | POA: Diagnosis not present

## 2021-01-04 DIAGNOSIS — M2550 Pain in unspecified joint: Secondary | ICD-10-CM | POA: Diagnosis not present

## 2021-01-04 DIAGNOSIS — R5382 Chronic fatigue, unspecified: Secondary | ICD-10-CM | POA: Diagnosis not present

## 2021-01-19 DIAGNOSIS — L7211 Pilar cyst: Secondary | ICD-10-CM | POA: Diagnosis not present

## 2021-01-28 DIAGNOSIS — D485 Neoplasm of uncertain behavior of skin: Secondary | ICD-10-CM | POA: Diagnosis not present

## 2021-01-28 DIAGNOSIS — L7211 Pilar cyst: Secondary | ICD-10-CM | POA: Diagnosis not present

## 2021-01-30 DIAGNOSIS — M170 Bilateral primary osteoarthritis of knee: Secondary | ICD-10-CM | POA: Diagnosis not present

## 2021-01-30 DIAGNOSIS — E785 Hyperlipidemia, unspecified: Secondary | ICD-10-CM | POA: Diagnosis not present

## 2021-01-30 DIAGNOSIS — M0579 Rheumatoid arthritis with rheumatoid factor of multiple sites without organ or systems involvement: Secondary | ICD-10-CM | POA: Diagnosis not present

## 2021-01-30 DIAGNOSIS — I10 Essential (primary) hypertension: Secondary | ICD-10-CM | POA: Diagnosis not present

## 2021-02-24 ENCOUNTER — Other Ambulatory Visit: Payer: Self-pay

## 2021-02-25 ENCOUNTER — Ambulatory Visit (INDEPENDENT_AMBULATORY_CARE_PROVIDER_SITE_OTHER): Payer: BC Managed Care – PPO

## 2021-02-25 ENCOUNTER — Ambulatory Visit (INDEPENDENT_AMBULATORY_CARE_PROVIDER_SITE_OTHER): Payer: BC Managed Care – PPO | Admitting: Sports Medicine

## 2021-02-25 ENCOUNTER — Encounter: Payer: Self-pay | Admitting: Sports Medicine

## 2021-02-25 ENCOUNTER — Other Ambulatory Visit: Payer: Self-pay

## 2021-02-25 DIAGNOSIS — Z8739 Personal history of other diseases of the musculoskeletal system and connective tissue: Secondary | ICD-10-CM

## 2021-02-25 DIAGNOSIS — M79674 Pain in right toe(s): Secondary | ICD-10-CM

## 2021-02-25 DIAGNOSIS — M2041 Other hammer toe(s) (acquired), right foot: Secondary | ICD-10-CM | POA: Diagnosis not present

## 2021-02-25 DIAGNOSIS — B351 Tinea unguium: Secondary | ICD-10-CM | POA: Diagnosis not present

## 2021-02-25 DIAGNOSIS — M19079 Primary osteoarthritis, unspecified ankle and foot: Secondary | ICD-10-CM

## 2021-02-25 DIAGNOSIS — M79671 Pain in right foot: Secondary | ICD-10-CM

## 2021-02-25 DIAGNOSIS — R202 Paresthesia of skin: Secondary | ICD-10-CM

## 2021-02-25 DIAGNOSIS — M79672 Pain in left foot: Secondary | ICD-10-CM

## 2021-02-25 DIAGNOSIS — M25472 Effusion, left ankle: Secondary | ICD-10-CM

## 2021-02-25 DIAGNOSIS — M2042 Other hammer toe(s) (acquired), left foot: Secondary | ICD-10-CM

## 2021-02-25 DIAGNOSIS — M25471 Effusion, right ankle: Secondary | ICD-10-CM | POA: Diagnosis not present

## 2021-02-25 DIAGNOSIS — M2141 Flat foot [pes planus] (acquired), right foot: Secondary | ICD-10-CM

## 2021-02-25 DIAGNOSIS — M2142 Flat foot [pes planus] (acquired), left foot: Secondary | ICD-10-CM

## 2021-02-25 DIAGNOSIS — M722 Plantar fascial fibromatosis: Secondary | ICD-10-CM

## 2021-02-25 DIAGNOSIS — M79675 Pain in left toe(s): Secondary | ICD-10-CM

## 2021-02-25 NOTE — Patient Instructions (Signed)
Nervive OTC supplement for your nerves  Vinegar soaks 1 cup of white distilled vinegar to 8 cups of warm water.  Soak 20 mins. May repeat soak two times per week.  If there is thickness to nails may file nails after soaks or after bath/shower with nail file and apply tea tree oil. Apply oil daily to nails after filing for the best result.

## 2021-02-25 NOTE — Progress Notes (Signed)
fot

## 2021-02-25 NOTE — Progress Notes (Signed)
Subjective: Jodi Duran is a 65 y.o. female patient with history of RA presents to office for evaluation of multiple complaints 1: Swelling to both feet especially at the ankles that has slowly gotten worse over a year states that she had previous vascular testing that stated that her veins were normal.  Patient reports that she wears compression garments but still struggles with swelling.  2: Tingling and numbness in both feet that comes and goes reports that her feet tend to feel numb and tingle all the time with pain that radiates to her toes and along both of her arches 3: Reports that her nails are very thick and sometimes she gets pain along the big toe at the nail border and is wondering why her fourth toes curved down.  Patient denies any other pedal complaints.  Admits that she is currently on Enbrel and also has a issue with osteoarthritis in both knees.   Patient Active Problem List   Diagnosis Date Noted   Pain in left knee 11/18/2019   Obesity (BMI 30-39.9) 10/23/2019   COVID-19 virus detected 10/23/2019   Essential hypertension 05/21/2019   Baker's cyst of knee, left 05/21/2019   Swelling of limb 05/21/2019   Encounter for screening colonoscopy 06/18/2014   Corneal ulcer 02/11/2013   SHOULDER PAIN 11/13/2007   IMPINGEMENT SYNDROME 11/13/2007   Current Outpatient Medications on File Prior to Visit  Medication Sig Dispense Refill   celecoxib (CELEBREX) 100 MG capsule TAKE 1 CAPSULE BY MOUTH TWICE DAILY FOR JOINT PAIN     Conj Estrogens-Bazedoxifene (DUAVEE) 0.45-20 MG TABS Take 1 tablet by mouth daily.     cyclobenzaprine (FLEXERIL) 5 MG tablet Take 5 mg by mouth 3 (three) times daily as needed for muscle spasms.     ENBREL SURECLICK 50 MG/ML injection Inject into the skin once a week.     hydrochlorothiazide (HYDRODIURIL) 25 MG tablet Take 25 mg by mouth daily.     hydrochlorothiazide (HYDRODIURIL) 25 MG tablet Take 25 mg by mouth daily.     ibuprofen (ADVIL) 800 MG  tablet Take 800 mg by mouth every 8 (eight) hours as needed.     meloxicam (MOBIC) 15 MG tablet meloxicam 15 mg tablet  TAKE 1 TABLET BY MOUTH ONCE DAILY WITH FOOD     naproxen (NAPROSYN) 500 MG tablet Take 500 mg by mouth 2 (two) times daily.     Naproxen-Esomeprazole (VIMOVO) 500-20 MG TBEC Take by mouth.     predniSONE (DELTASONE) 5 MG tablet Take 5 mg by mouth daily with breakfast.     traMADol (ULTRAM) 50 MG tablet TAKE 1 TABLET BY MOUTH IN THE MORNING AND AFTER SUPPER     No current facility-administered medications on file prior to visit.   No Known Allergies  No results found for this or any previous visit (from the past 2160 hour(s)).  Objective: General: Patient is awake, alert, and oriented x 3 and in no acute distress.  Integument: Skin is warm, dry and supple bilateral. Nails are tender, long, thickened and  dystrophic with moderate subungual debris, consistent with onychomycosis, 1-5 bilateral. No signs of infection.  Minimal reactive keratosis at the distal tuft of bilateral fourth toes from friction from hammertoe deformity.  No open lesions or preulcerative lesions present bilateral. Remaining integument unremarkable.  Vasculature:  Dorsalis Pedis pulse 0/4 bilateral. Posterior Tibial pulse  0/4 bilateral.  Capillary fill time <3 sec 1-5 bilateral. Positive hair growth to the level of the digits.  Severe varicosities  bilateral.  There is significant edema to ankles and it is difficult to palpate pulses due to patient's excessive fat/body habitus.  Neurology: The patient has intact sensation measured with a 5.07/10g Semmes Weinstein Monofilament at all pedal sites bilateral . Vibratory sensation diminished bilateral with tuning fork.  Numbness and tingling bilateral subjective to both feet.  Musculoskeletal: Diffuse pain to palpation all over most noted at plantar surfaces along the plantar fascial tendon as well as medial arches with significant pes planus deformity noted  bilateral.  There is limited joint range of motion bilateral with palpable dorsal bone spurs at the midfoot with history supportive of arthritis.  Assessment and Plan: Problem List Items Addressed This Visit   None Visit Diagnoses     Bilateral foot pain    -  Primary   Relevant Orders   DG Foot Complete Left   DG Foot Complete Right   Pain due to onychomycosis of toenails of both feet       Ankle edema, bilateral       Tingling of both feet       Pes planus of both feet       Arthritis of foot       Relevant Medications   ENBREL SURECLICK 50 MG/ML injection   History of rheumatoid arthritis       Hammer toes of both feet       Plantar fasciitis, bilateral             -Examined patient. -X-rays reviewed consistent with diffuse arthritis and significant planus deformity -Discussed with patient all of her multiple complaints and advised her that we will slowly address 1 at a time since she has multiple -Advised patient to monitor swelling and to decrease sodium intake to help with edema control -Advised patient to continue with compression garments -Advised patient if symptoms continue to worsen with swelling we may refer her back to a vascular specialist for a second opinion -Advised patient that numbness and tingling may be a result of excessive swelling versus other underlying issues at this time I advised patient to try over-the-counter Nervive vitamin supplements -Advised gentle stretching for pain in the arches and good supportive shoes daily for foot type -Mechanically debrided nails and advised patient that if her symptoms around her big toenails worsen may benefit from more aggressive treatment however due to patient's arthritic history and current immunosuppressive drugs I do not recommend oral medication for fungus so patient will only be a candidate for topical or nail avulsion procedure if worsens meanwhile may try vinegar soaking and topical Vicks rub as directed to her  toenails -Dispensed toe caps for patient to use bilateral fourth toes and at great toe especially on the left that sometimes is sore as needed -Patient advised to call the office if any problems or questions arise in the meantime. -Return to office in 1 month for follow-up evaluation.  Landis Martins, DPM

## 2021-03-01 ENCOUNTER — Other Ambulatory Visit: Payer: Self-pay | Admitting: Sports Medicine

## 2021-03-01 DIAGNOSIS — M2041 Other hammer toe(s) (acquired), right foot: Secondary | ICD-10-CM

## 2021-03-08 DIAGNOSIS — M150 Primary generalized (osteo)arthritis: Secondary | ICD-10-CM | POA: Diagnosis not present

## 2021-03-08 DIAGNOSIS — M25561 Pain in right knee: Secondary | ICD-10-CM | POA: Diagnosis not present

## 2021-03-08 DIAGNOSIS — M25562 Pain in left knee: Secondary | ICD-10-CM | POA: Diagnosis not present

## 2021-03-08 DIAGNOSIS — M0579 Rheumatoid arthritis with rheumatoid factor of multiple sites without organ or systems involvement: Secondary | ICD-10-CM | POA: Diagnosis not present

## 2021-03-08 DIAGNOSIS — M2550 Pain in unspecified joint: Secondary | ICD-10-CM | POA: Diagnosis not present

## 2021-03-08 DIAGNOSIS — R5382 Chronic fatigue, unspecified: Secondary | ICD-10-CM | POA: Diagnosis not present

## 2021-03-16 DIAGNOSIS — M0579 Rheumatoid arthritis with rheumatoid factor of multiple sites without organ or systems involvement: Secondary | ICD-10-CM | POA: Diagnosis not present

## 2021-03-16 DIAGNOSIS — I10 Essential (primary) hypertension: Secondary | ICD-10-CM | POA: Diagnosis not present

## 2021-03-16 DIAGNOSIS — Z0001 Encounter for general adult medical examination with abnormal findings: Secondary | ICD-10-CM | POA: Diagnosis not present

## 2021-03-16 DIAGNOSIS — M170 Bilateral primary osteoarthritis of knee: Secondary | ICD-10-CM | POA: Diagnosis not present

## 2021-03-25 ENCOUNTER — Other Ambulatory Visit (HOSPITAL_COMMUNITY): Payer: Self-pay | Admitting: Family Medicine

## 2021-03-25 DIAGNOSIS — Z1231 Encounter for screening mammogram for malignant neoplasm of breast: Secondary | ICD-10-CM

## 2021-04-01 ENCOUNTER — Ambulatory Visit (INDEPENDENT_AMBULATORY_CARE_PROVIDER_SITE_OTHER): Payer: BC Managed Care – PPO | Admitting: Sports Medicine

## 2021-04-01 ENCOUNTER — Encounter: Payer: Self-pay | Admitting: Sports Medicine

## 2021-04-01 ENCOUNTER — Other Ambulatory Visit: Payer: Self-pay

## 2021-04-01 DIAGNOSIS — M25471 Effusion, right ankle: Secondary | ICD-10-CM | POA: Diagnosis not present

## 2021-04-01 DIAGNOSIS — M19079 Primary osteoarthritis, unspecified ankle and foot: Secondary | ICD-10-CM | POA: Diagnosis not present

## 2021-04-01 DIAGNOSIS — L603 Nail dystrophy: Secondary | ICD-10-CM

## 2021-04-01 DIAGNOSIS — M79671 Pain in right foot: Secondary | ICD-10-CM

## 2021-04-01 DIAGNOSIS — M2142 Flat foot [pes planus] (acquired), left foot: Secondary | ICD-10-CM

## 2021-04-01 DIAGNOSIS — M79672 Pain in left foot: Secondary | ICD-10-CM

## 2021-04-01 DIAGNOSIS — M2141 Flat foot [pes planus] (acquired), right foot: Secondary | ICD-10-CM | POA: Diagnosis not present

## 2021-04-01 DIAGNOSIS — R202 Paresthesia of skin: Secondary | ICD-10-CM

## 2021-04-01 DIAGNOSIS — M25472 Effusion, left ankle: Secondary | ICD-10-CM

## 2021-04-01 NOTE — Progress Notes (Signed)
Subjective: Jodi Duran is a 65 y.o. female patient with history of RA presents to office for follow-up evaluation of multiple complaints 1: Swelling to both feet reports that things are about the same still on her fluid medication and trying to use compression.  2: Tingling and numbness things are about the same tried over-the-counter nerve supplement with very minimal relief 3: Nails and abnormal curvature of toes states that her toes do not bother her but did not really notice a difference with the toe caps.  Patient denies any other pedal complaints.  Patient has a history of RA on Enbrel and has history of bilateral osteoarthritis of knees   Patient Active Problem List   Diagnosis Date Noted   Pain in left knee 11/18/2019   Obesity (BMI 30-39.9) 10/23/2019   COVID-19 virus detected 10/23/2019   Essential hypertension 05/21/2019   Baker's cyst of knee, left 05/21/2019   Swelling of limb 05/21/2019   Encounter for screening colonoscopy 06/18/2014   Corneal ulcer 02/11/2013   SHOULDER PAIN 11/13/2007   IMPINGEMENT SYNDROME 11/13/2007   Current Outpatient Medications on File Prior to Visit  Medication Sig Dispense Refill   celecoxib (CELEBREX) 100 MG capsule TAKE 1 CAPSULE BY MOUTH TWICE DAILY FOR JOINT PAIN     Conj Estrogens-Bazedoxifene (DUAVEE) 0.45-20 MG TABS Take 1 tablet by mouth daily.     cyclobenzaprine (FLEXERIL) 5 MG tablet Take 5 mg by mouth 3 (three) times daily as needed for muscle spasms.     ENBREL SURECLICK 50 MG/ML injection Inject into the skin once a week.     hydrochlorothiazide (HYDRODIURIL) 25 MG tablet Take 25 mg by mouth daily.     hydrochlorothiazide (HYDRODIURIL) 25 MG tablet Take 25 mg by mouth daily.     ibuprofen (ADVIL) 800 MG tablet Take 800 mg by mouth every 8 (eight) hours as needed.     meloxicam (MOBIC) 15 MG tablet meloxicam 15 mg tablet  TAKE 1 TABLET BY MOUTH ONCE DAILY WITH FOOD     naproxen (NAPROSYN) 500 MG tablet Take 500 mg by mouth 2  (two) times daily.     Naproxen-Esomeprazole (VIMOVO) 500-20 MG TBEC Take by mouth.     predniSONE (DELTASONE) 5 MG tablet Take 5 mg by mouth daily with breakfast.     traMADol (ULTRAM) 50 MG tablet TAKE 1 TABLET BY MOUTH IN THE MORNING AND AFTER SUPPER     No current facility-administered medications on file prior to visit.   No Known Allergies  No results found for this or any previous visit (from the past 2160 hour(s)).  Objective: General: Patient is awake, alert, and oriented x 3 and in no acute distress.  Integument: Skin is warm, dry and supple bilateral. Nails are thickened and  dystrophic with moderate subungual debris, consistent with onychomycosis, 1-5 bilateral. No signs of infection.  Minimal reactive keratosis at the distal tuft of bilateral fourth toes from friction from hammertoe deformity.  No open lesions or preulcerative lesions present bilateral. Remaining integument unremarkable.  Vasculature:  Dorsalis Pedis pulse 0/4 bilateral. Posterior Tibial pulse  0/4 bilateral.  Capillary fill time <3 sec 1-5 bilateral. Positive hair growth to the level of the digits.  Severe varicosities bilateral.  There is significant edema to ankles and it is difficult to palpate pulses due to patient's excessive fat/body habitus.  Neurology: The patient has intact sensation measured with a 5.07/10g Semmes Weinstein Monofilament at all pedal sites bilateral . Vibratory sensation diminished bilateral with tuning fork.  Unchanged from prior.  Numbness and tingling bilateral subjective to both feet.  Musculoskeletal: Decreased pain to palpation all over most noted at plantar surfaces along the plantar fascial tendon as well as medial arches that is much relieved with additional arch padding with significant pes planus deformity noted bilateral.  There is limited joint range of motion bilateral with palpable dorsal bone spurs at the midfoot with history supportive of arthritis.  Assessment and  Plan: Problem List Items Addressed This Visit   None Visit Diagnoses     Pes planus of both feet    -  Primary   Onychodystrophy       Relevant Orders   Culture, fungus without smear   Ankle edema, bilateral       Arthritis of foot       Tingling of both feet       Bilateral foot pain             -Examined patient. -Discussed briefly all complaints or concerns this visit with patient -Advised close monitoring of swelling to legs and feels to improve or worsens to consider reconsult to vein and vascular however at this time patient desires to hold off on this evaluation since things are staying the same -Advised patient to continue with compression garments -Advised patient that numbness and tingling may be a result of excessive swelling versus other underlying issues again this visit and advised patient to continue with over-the-counter Nervive vitamin supplements if symptoms fail to improve may benefit from a referral to neurology -Orthotic estimate form provided to patient and advised custom functional foot orthotics for her foot pain and for her flatfeet -Dispensed arch pads this visit -Advised patient to continue with gentle stretching for pain in the arches and good supportive shoes daily for foot type -Advised patient at this time we will continue with monitoring her toenails and if her nails continue to worsen she would try topical versus considering avulsion procedure however we would need medical clearance from her rheumatologist before doing so -Patient to return to office for orthotics or sooner if issues arise  Landis Martins, DPM

## 2021-04-16 ENCOUNTER — Ambulatory Visit (HOSPITAL_COMMUNITY): Payer: BC Managed Care – PPO

## 2021-04-28 ENCOUNTER — Other Ambulatory Visit: Payer: Self-pay

## 2021-04-28 ENCOUNTER — Ambulatory Visit (HOSPITAL_COMMUNITY)
Admission: RE | Admit: 2021-04-28 | Discharge: 2021-04-28 | Disposition: A | Discharge status: Home / Self Care | Payer: BC Managed Care – PPO | Source: Ambulatory Visit | Attending: Family Medicine | Admitting: Family Medicine

## 2021-04-28 DIAGNOSIS — Z1231 Encounter for screening mammogram for malignant neoplasm of breast: Secondary | ICD-10-CM | POA: Diagnosis not present

## 2021-05-03 ENCOUNTER — Other Ambulatory Visit (HOSPITAL_COMMUNITY): Payer: Self-pay | Admitting: Family Medicine

## 2021-05-11 ENCOUNTER — Other Ambulatory Visit (HOSPITAL_COMMUNITY): Payer: Self-pay | Admitting: Family Medicine

## 2021-05-18 ENCOUNTER — Other Ambulatory Visit (HOSPITAL_COMMUNITY): Payer: Self-pay | Admitting: Family Medicine

## 2021-05-18 ENCOUNTER — Telehealth: Payer: Self-pay | Admitting: Sports Medicine

## 2021-05-18 DIAGNOSIS — R928 Other abnormal and inconclusive findings on diagnostic imaging of breast: Secondary | ICD-10-CM

## 2021-05-18 NOTE — Telephone Encounter (Signed)
Left message for pt to call to discuss orthotic benefits. °

## 2021-05-18 NOTE — Telephone Encounter (Signed)
Per [facility] @ [facility]  orthotics(L3020) is valid and billable code and no Josem Kaufmann is required covered @ 40% after 3000.00 deductible(Met 1493.62) out of pocket is 8550.00(met 2231.60) ref # [id number].Marland Kitchen

## 2021-05-21 ENCOUNTER — Telehealth: Payer: Self-pay | Admitting: Sports Medicine

## 2021-05-21 NOTE — Telephone Encounter (Signed)
Pt has an appt scheduled for orthotics casting and has left a message for a call back to discuss.  I returned call and left message to call me back to discuss appt.

## 2021-05-26 ENCOUNTER — Telehealth: Payer: Self-pay | Admitting: Sports Medicine

## 2021-05-26 NOTE — Telephone Encounter (Signed)
Pt called and stated she was returning a call about the appt scheduled for orthotics. She said she was awaiting a call with benefit information and I told pt I had left a message on 9.6 to call to discuss. Covered @ 40% after 3000.00 deductible and pt has not met deductible. She is requesting to hold off until she meets her deductible and I have canceled the appt for this Friday for the orthotic casting and she will call to r/s.

## 2021-05-28 ENCOUNTER — Other Ambulatory Visit: Payer: BC Managed Care – PPO

## 2021-06-01 ENCOUNTER — Other Ambulatory Visit: Payer: Self-pay

## 2021-06-01 ENCOUNTER — Ambulatory Visit (HOSPITAL_COMMUNITY)
Admission: RE | Admit: 2021-06-01 | Discharge: 2021-06-01 | Disposition: A | Discharge status: Home / Self Care | Payer: BC Managed Care – PPO | Source: Ambulatory Visit | Attending: Family Medicine | Admitting: Family Medicine

## 2021-06-01 DIAGNOSIS — N6489 Other specified disorders of breast: Secondary | ICD-10-CM | POA: Diagnosis not present

## 2021-06-01 DIAGNOSIS — R922 Inconclusive mammogram: Secondary | ICD-10-CM | POA: Diagnosis not present

## 2021-06-01 DIAGNOSIS — R928 Other abnormal and inconclusive findings on diagnostic imaging of breast: Secondary | ICD-10-CM | POA: Insufficient documentation

## 2021-06-30 DIAGNOSIS — M0579 Rheumatoid arthritis with rheumatoid factor of multiple sites without organ or systems involvement: Secondary | ICD-10-CM | POA: Diagnosis not present

## 2021-06-30 DIAGNOSIS — R5382 Chronic fatigue, unspecified: Secondary | ICD-10-CM | POA: Diagnosis not present

## 2021-06-30 DIAGNOSIS — M150 Primary generalized (osteo)arthritis: Secondary | ICD-10-CM | POA: Diagnosis not present

## 2021-06-30 DIAGNOSIS — M2550 Pain in unspecified joint: Secondary | ICD-10-CM | POA: Diagnosis not present

## 2021-08-18 ENCOUNTER — Emergency Department (HOSPITAL_COMMUNITY)
Admission: EM | Admit: 2021-08-18 | Discharge: 2021-08-18 | Disposition: A | Discharge status: Home / Self Care | Payer: BC Managed Care – PPO | Attending: Emergency Medicine | Admitting: Emergency Medicine

## 2021-08-18 ENCOUNTER — Encounter (HOSPITAL_COMMUNITY): Payer: Self-pay | Admitting: Emergency Medicine

## 2021-08-18 ENCOUNTER — Other Ambulatory Visit: Payer: Self-pay

## 2021-08-18 ENCOUNTER — Emergency Department (HOSPITAL_COMMUNITY): Payer: BC Managed Care – PPO

## 2021-08-18 DIAGNOSIS — Z79899 Other long term (current) drug therapy: Secondary | ICD-10-CM | POA: Diagnosis not present

## 2021-08-18 DIAGNOSIS — Z87891 Personal history of nicotine dependence: Secondary | ICD-10-CM | POA: Diagnosis not present

## 2021-08-18 DIAGNOSIS — M1711 Unilateral primary osteoarthritis, right knee: Secondary | ICD-10-CM | POA: Diagnosis not present

## 2021-08-18 DIAGNOSIS — M25562 Pain in left knee: Secondary | ICD-10-CM | POA: Diagnosis not present

## 2021-08-18 DIAGNOSIS — Z8616 Personal history of COVID-19: Secondary | ICD-10-CM | POA: Diagnosis not present

## 2021-08-18 DIAGNOSIS — I10 Essential (primary) hypertension: Secondary | ICD-10-CM | POA: Diagnosis not present

## 2021-08-18 DIAGNOSIS — M25561 Pain in right knee: Secondary | ICD-10-CM | POA: Diagnosis not present

## 2021-08-18 DIAGNOSIS — M1712 Unilateral primary osteoarthritis, left knee: Secondary | ICD-10-CM | POA: Diagnosis not present

## 2021-08-18 MED ORDER — HYDROCODONE-ACETAMINOPHEN 5-325 MG PO TABS: 1.0000 | ORAL_TABLET | Freq: Four times a day (QID) | ORAL | 0 refills | Status: DC | PRN

## 2021-08-18 NOTE — ED Triage Notes (Signed)
Pt arrives POV with c/o right knee swelling and pain that started 08/12/21 without known injury. Has taken advil without relief. Movement and standing makes it worse. Right knee noted to be slightly warmer to touch and slightly more swollen than left knee.

## 2021-08-18 NOTE — ED Provider Notes (Signed)
[facility] EMERGENCY DEPARTMENT Provider Note   CSN: [id number] Arrival date & time: 08/18/21  1829     History Chief Complaint  Patient presents with   Knee Pain    Jodi Duran is a 65 y.o. female with a history of bilateral osteoarthritis in both knees and Bakers cyst of left, diagnosed by Dr. Lyla Glassing who has treated her with steroid injections in the past, presenting with worsening pain and swelling of the right knee for the past 10 days.  She endorses pain with weight bearing , better at rest, and feels unsteadiness when standing on the knee, sensation of it trying to "give way" but denies any falls or new injury.  She was recently under the care of a rheumatologist as she has mild RA mostly affecting her hands and fingers, on Embrel.  She denies fevers, chills or other constitutional sx. She has taken advil without improvement in her symptoms. She also chronically takes arthritis strength tylenol.   The history is provided by the patient.      Past Medical History:  Diagnosis Date   Arthritis    Hypertension     Patient Active Problem List   Diagnosis Date Noted   Pain in left knee 11/18/2019   Obesity (BMI 30-39.9) 10/23/2019   COVID-19 virus detected 10/23/2019   Essential hypertension 05/21/2019   Baker's cyst of knee, left 05/21/2019   Swelling of limb 05/21/2019   Encounter for screening colonoscopy 06/18/2014   Corneal ulcer 02/11/2013   SHOULDER PAIN 11/13/2007   IMPINGEMENT SYNDROME 11/13/2007    Past Surgical History:  Procedure Laterality Date   COLONOSCOPY  06/21/2007   NWG:[id number] internal hemorrhoids/Large amount of liquid stool seen in the transverse colon and descending colon which contained particulate matter   COLONOSCOPY N/A 07/11/2014   Procedure: COLONOSCOPY;  Surgeon: Danie Binder, MD;  [facility];  Service: Endoscopy;  Laterality: N/A;  10:30  - moved to 10:45 - Ginger to notify pt     OB History   No obstetric history  on file.     Family History  Problem Relation Age of Onset   Diabetes Mother    Congestive Heart Failure Mother     Social History   Tobacco Use   Smoking status: Former    Packs/day: 0.25    Years: 15.00    Pack years: 3.75    Types: Cigarettes   Smokeless tobacco: Never  Vaping Use   Vaping Use: Never used  Substance Use Topics   Alcohol use: Yes    Comment: Socially   Drug use: No    Home Medications Prior to Admission medications   Medication Sig Start Date End Date Taking? Authorizing Provider  HYDROcodone-acetaminophen (NORCO/VICODIN) 5-325 MG tablet Take 1 tablet by mouth every 6 (six) hours as needed for severe pain. 08/18/21  Yes Farrell Pantaleo, Almyra Free, PA-C  HYDROcodone-acetaminophen (NORCO/VICODIN) 5-325 MG tablet Take 1 tablet by mouth every 6 (six) hours as needed for severe pain. 08/18/21  Yes Gavin Telford, Almyra Free, PA-C  Conj Estrogens-Bazedoxifene (DUAVEE) 0.45-20 MG TABS Take 1 tablet by mouth daily.    [provider]  cyclobenzaprine (FLEXERIL) 5 MG tablet Take 5 mg by mouth 3 (three) times daily as needed for muscle spasms.    [provider]  ENBREL SURECLICK 50 MG/ML injection Inject into the skin once a week. 02/23/21   [provider]  hydrochlorothiazide (HYDRODIURIL) 25 MG tablet Take 25 mg by mouth daily.    [provider]  hydrochlorothiazide (HYDRODIURIL) 25 MG tablet Take 25 mg by mouth daily.    [provider]  naproxen (NAPROSYN) 500 MG tablet Take 500 mg by mouth 2 (two) times daily. 12/10/18   [provider]  Naproxen-Esomeprazole (VIMOVO) 500-20 MG TBEC Take by mouth.    [provider]  predniSONE (DELTASONE) 5 MG tablet Take 5 mg by mouth daily with breakfast.    [provider]    Allergies    Patient has no known allergies.  Review of Systems   Review of Systems  Constitutional:  Negative for chills and fever.  Respiratory: Negative.    Cardiovascular: Negative.    Gastrointestinal: Negative.   Musculoskeletal:  Positive for arthralgias and joint swelling. Negative for myalgias.  Skin:  Negative for color change.  Neurological:  Negative for weakness and numbness.   Physical Exam Updated Vital Signs BP 136/77 (BP Location: Right Arm)   Pulse 80   Temp 98.3 F (36.8 C) (Oral)   Resp 18   Ht 5\' 7"  (1.702 m)   Wt 116.6 kg   SpO2 94%   BMI 40.25 kg/m   Physical Exam Constitutional:      Appearance: She is well-developed.  HENT:     Head: Atraumatic.  Cardiovascular:     Comments: Pulses equal bilaterally Musculoskeletal:        General: Swelling and tenderness present. No deformity.     Cervical back: Normal range of motion.     Right knee: Effusion and bony tenderness present. No erythema. Decreased range of motion.     Comments: No significant increased warmth or erythema of knee joints.  Crepitus appreciated with active and passive ROM.  No ligament instability.  Effusion present.   Skin:    General: Skin is warm and dry.  Neurological:     Mental Status: She is alert.     Sensory: No sensory deficit.     Motor: No weakness.     Deep Tendon Reflexes: Reflexes normal.    ED Results / Procedures / Treatments   Labs (all labs ordered are listed, but only abnormal results are displayed) Labs Reviewed - No data to display  EKG None  Radiology No results found.  Procedures Procedures   Medications Ordered in ED Medications - No data to display  ED Course  I have reviewed the triage vital signs and the nursing notes.  Pertinent labs & imaging results that were available during my care of the patient were reviewed by me and considered in my medical decision making (see chart for details).    MDM Rules/Calculators/A&P                           Imaging reviewed and discussed with pt. She has no signs of a septic knee joint.  Joint is not hot.  No hx of gout. She can flex/extend the knee which feels tight, some discomfort  but no severe pain. The knee is not erythematous, no sig increased warmth in comparison to the left.  Imaging showing severe djd, tricompartmental with effusion.  She was strongly encouraged f/u with Dr. Lyla Glassing, in the interim,  knee immobilizer placed, discussed minimizing flex/ext which will exacerbation effusion.  Elevation,  discussed roles of ice and heat.  She reports trouble sleeping at night due to knee pain, prescribed hydrocodone, advised regarding safe maximum doses of tylenol.     Final Clinical Impression(s) / ED Diagnoses Final diagnoses:  Primary osteoarthritis of right knee    Rx / DC Orders ED Discharge Orders          Ordered    HYDROcodone-acetaminophen (NORCO/VICODIN) 5-325 MG tablet  Every 6 hours PRN        08/18/21 2248    HYDROcodone-acetaminophen (NORCO/VICODIN) 5-325 MG tablet  Every 6 hours PRN        08/18/21 2248             Evalee Jefferson, PA-C 08/21/21 1154    Milton Ferguson, MD 08/21/21 1330

## 2021-08-18 NOTE — Discharge Instructions (Signed)
As discussed you do have a fairly severe degenerative changes in your right knee otherwise known as osteoarthritis.  I do recommend following up with Dr. Lyla Glassing for further management of your knee pain.  In the interim wear the knee immobilizer for comfort, remember that the more the knee joint moves and bends the more swelling may develop.  You may apply ice to help with swelling, although sometimes heat application can be more soothing for arthritis pain.  You may take the medication hydrocodone as prescribed if needed for severe pain, especially at night to help you sleep.  This medicine will make you drowsy, do not drive within 4 hours of taking hydrocodone.  You may continue using your Aleve twice daily while taking hydrocodone.  I would hold your arthritis strength Tylenol while taking the hydrocodone since hydrocodone also contains Tylenol.

## 2021-09-02 ENCOUNTER — Other Ambulatory Visit: Payer: Self-pay

## 2021-09-02 ENCOUNTER — Ambulatory Visit
Admission: EM | Admit: 2021-09-02 | Discharge: 2021-09-02 | Disposition: A | Discharge status: Home / Self Care | Payer: BC Managed Care – PPO

## 2021-09-02 DIAGNOSIS — G5701 Lesion of sciatic nerve, right lower limb: Secondary | ICD-10-CM

## 2021-09-02 MED ORDER — CYCLOBENZAPRINE HCL 5 MG PO TABS: 5.0000 mg | ORAL_TABLET | Freq: Three times a day (TID) | ORAL | 0 refills | Status: DC | PRN

## 2021-09-02 MED ORDER — KETOROLAC TROMETHAMINE 60 MG/2ML IM SOLN
60.0000 mg | Freq: Once | INTRAMUSCULAR | Status: AC
  Administered 2021-09-02: 17:00:00 60 mg via INTRAMUSCULAR

## 2021-09-02 MED ORDER — PREDNISONE 20 MG PO TABS: 40.0000 mg | ORAL_TABLET | Freq: Every day | ORAL | 0 refills | Status: DC

## 2021-09-02 NOTE — ED Provider Notes (Signed)
[facility]    CSN: [id number] Arrival date & time: 09/02/21  1552      History   Chief Complaint Chief Complaint  Patient presents with   Hip Pain    Sciatic pain in right hip ,leg and knee    HPI Jodi Duran is a 65 y.o. female.   Patient presenting today with 4-day history of right buttock pain that shoots down her legs with weightbearing or movement.  She denies any injury, states that she was just going about her day when she felt a strong catch in her right buttock and the pain began directly after that.  She denies weakness, numbness, tingling, bowel or bladder incontinence, saddle anesthesia, fever, chills, injury of any kind.  Has been taking hydrocodone that she got from the hospital for her right knee arthritis with minimal relief.   Past Medical History:  Diagnosis Date   Arthritis    Hypertension     Patient Active Problem List   Diagnosis Date Noted   Pain in left knee 11/18/2019   Obesity (BMI 30-39.9) 10/23/2019   COVID-19 virus detected 10/23/2019   Essential hypertension 05/21/2019   Baker's cyst of knee, left 05/21/2019   Swelling of limb 05/21/2019   Encounter for screening colonoscopy 06/18/2014   Corneal ulcer 02/11/2013   SHOULDER PAIN 11/13/2007   IMPINGEMENT SYNDROME 11/13/2007    Past Surgical History:  Procedure Laterality Date   COLONOSCOPY  06/21/2007   QQV:[id number] internal hemorrhoids/Large amount of liquid stool seen in the transverse colon and descending colon which contained particulate matter   COLONOSCOPY N/A 07/11/2014   Procedure: COLONOSCOPY;  Surgeon: Danie Binder, MD;  Location: [facility];  Service: Endoscopy;  Laterality: N/A;  10:30  - moved to 10:45 - Ginger to notify pt    OB History   No obstetric history on file.      Home Medications    Prior to Admission medications   Medication Sig Start Date End Date Taking? Authorizing Provider  cyclobenzaprine (FLEXERIL) 5 MG tablet Take 1 tablet  (5 mg total) by mouth 3 (three) times daily as needed for muscle spasms. Do not drink alcohol or drive while taking this medication.  May cause drowsiness. 09/02/21  Yes Volney American, PA-C  predniSONE (DELTASONE) 20 MG tablet Take 2 tablets (40 mg total) by mouth daily with breakfast. 09/02/21  Yes Volney American, PA-C  Conj Estrogens-Bazedoxifene 0.45-20 MG TABS Take 1 tablet by mouth daily.    [provider]  cyclobenzaprine (FLEXERIL) 5 MG tablet Take 5 mg by mouth 3 (three) times daily as needed for muscle spasms.    [provider]  ENBREL SURECLICK 50 MG/ML injection Inject into the skin once a week. 02/23/21   [provider]  hydrochlorothiazide (HYDRODIURIL) 25 MG tablet Take 25 mg by mouth daily.    [provider]  hydrochlorothiazide (HYDRODIURIL) 25 MG tablet Take 25 mg by mouth daily.    [provider]  HYDROcodone-acetaminophen (NORCO/VICODIN) 5-325 MG tablet Take 1 tablet by mouth every 6 (six) hours as needed for severe pain. 08/18/21   Evalee Jefferson, PA-C  HYDROcodone-acetaminophen (NORCO/VICODIN) 5-325 MG tablet Take 1 tablet by mouth every 6 (six) hours as needed for severe pain. 08/18/21   Evalee Jefferson, PA-C  naproxen (NAPROSYN) 500 MG tablet Take 500 mg by mouth 2 (two) times daily. 12/10/18   [provider]  Naproxen-Esomeprazole 500-20 MG TBEC Take by mouth.    [provider]  predniSONE (DELTASONE) 5 MG tablet Take 5 mg by mouth daily with breakfast.    [provider]  traMADol (ULTRAM) 50 MG tablet Take 50 mg by mouth 2 (two) times daily. 08/26/21   [provider]    Family History Family History  Problem Relation Age of Onset   Diabetes Mother    Congestive Heart Failure Mother     Social History Social History   Tobacco Use   Smoking status: Former    Packs/day: 0.25    Years: 15.00    Pack years: 3.75    Types: Cigarettes   Smokeless tobacco: Never  Vaping Use    Vaping Use: Never used  Substance Use Topics   Alcohol use: Yes    Comment: Socially   Drug use: No     Allergies   Patient has no known allergies.   Review of Systems Review of Systems Per HPI  Physical Exam Triage Vital Signs ED Triage Vitals  Enc Vitals Group     BP 09/02/21 1615 122/84     Pulse Rate 09/02/21 1615 (!) 112     Resp 09/02/21 1615 18     Temp 09/02/21 1615 98.2 F (36.8 C)     Temp Source 09/02/21 1615 Oral     SpO2 09/02/21 1615 96 %     Weight --      Height --      Head Circumference --      Peak Flow --      Pain Score 09/02/21 1612 10     Pain Loc --      Pain Edu? --      Excl. in [place]? --    No data found.  Updated Vital Signs BP 122/84 (BP Location: Right Arm)    Pulse (!) 112    Temp 98.2 F (36.8 C) (Oral)    Resp 18    SpO2 96%   Visual Acuity Right Eye Distance:   Left Eye Distance:   Bilateral Distance:    Right Eye Near:   Left Eye Near:    Bilateral Near:     Physical Exam Vitals and nursing note reviewed.  Constitutional:      Appearance: Normal appearance. She is not ill-appearing.  HENT:     Head: Atraumatic.  Eyes:     Extraocular Movements: Extraocular movements intact.     Conjunctiva/sclera: Conjunctivae normal.  Cardiovascular:     Rate and Rhythm: Normal rate and regular rhythm.     Heart sounds: Normal heart sounds.  Pulmonary:     Effort: Pulmonary effort is normal.     Breath sounds: Normal breath sounds.  Musculoskeletal:        General: Tenderness present. No swelling, deformity or signs of injury. Normal range of motion.     Cervical back: Normal range of motion and neck supple.     Comments: Negative straight leg raise bilateral lower extremities.  No midline spinal tenderness to palpation diffusely.  Currently in wheelchair given pain during ambulation, ambulatory at baseline  Skin:    General: Skin is warm and dry.     Findings: No erythema.  Neurological:     Mental Status: She is alert and  oriented to person, place, and time.     Comments: Bilateral lower extremities neurovascularly intact  Psychiatric:        Mood and Affect: Mood normal.        Thought Content: Thought content normal.  Judgment: Judgment normal.     UC Treatments / Results  Labs (all labs ordered are listed, but only abnormal results are displayed) Labs Reviewed - No data to display  EKG   Radiology No results found.  Procedures Procedures (including critical care time)  Medications Ordered in UC Medications  ketorolac (TORADOL) injection 60 mg (has no administration in time range)    Initial Impression / Assessment and Plan / UC Course  I have reviewed the triage vital signs and the nursing notes.  Pertinent labs & imaging results that were available during my care of the patient were reviewed by me and considered in my medical decision making (see chart for details).     Suspect piriformis syndrome to be causing her symptoms, IM Toradol given in clinic prior to discharge, prednisone and Flexeril sent.  Handout with exercises and stretches given for home use.  Return for acutely worsening symptoms.  Final Clinical Impressions(s) / UC Diagnoses   Final diagnoses:  Piriformis syndrome, right   Discharge Instructions   None    ED Prescriptions     Medication Sig Dispense Auth. Provider   predniSONE (DELTASONE) 20 MG tablet Take 2 tablets (40 mg total) by mouth daily with breakfast. 10 tablet Volney American, PA-C   cyclobenzaprine (FLEXERIL) 5 MG tablet Take 1 tablet (5 mg total) by mouth 3 (three) times daily as needed for muscle spasms. Do not drink alcohol or drive while taking this medication.  May cause drowsiness. 15 tablet Volney American, [place]      PDMP not reviewed this encounter.   Volney American, [place] 09/02/21 1649

## 2021-09-02 NOTE — ED Triage Notes (Signed)
Patient states she can not put any pressure on her right side for 4 days.  Patient states she has a lot of pain in her right leg and knee.   She states that when she lays down her lower body is pain.   Denies Injury

## 2021-10-14 ENCOUNTER — Encounter: Payer: Self-pay | Admitting: Orthopedic Surgery

## 2021-10-14 ENCOUNTER — Other Ambulatory Visit: Payer: Self-pay

## 2021-10-14 ENCOUNTER — Ambulatory Visit: Payer: BC Managed Care – PPO | Admitting: Orthopedic Surgery

## 2021-10-14 VITALS — BP 149/84 | HR 83 | Ht 67.0 in | Wt 253.0 lb

## 2021-10-14 DIAGNOSIS — M170 Bilateral primary osteoarthritis of knee: Secondary | ICD-10-CM

## 2021-10-14 MED ORDER — PREDNISONE 10 MG (48) PO TBPK: ORAL_TABLET | Freq: Every day | ORAL | 0 refills | Status: DC

## 2021-10-14 NOTE — Addendum Note (Signed)
Addended by: Carole Civil on: 10/14/2021 02:24 PM   Modules accepted: Orders

## 2021-10-14 NOTE — Progress Notes (Addendum)
Encounter Diagnosis  Name Primary?   Primary osteoarthritis of both knees Yes    Chief Complaint  Patient presents with   Knee Pain    Bilat knee pain R > L at its worst for 10 days   Procedure note for bilateral knee injections  Procedure note left knee injection verbal consent was obtained to inject left knee joint  Timeout was completed to confirm the site of injection  The medications used were 40 mg depomedrol and 3 cc of 1% lidocaine  Anesthesia was provided by ethyl chloride and the skin was prepped with alcohol.  After cleaning the skin with alcohol a 20-gauge needle was used to inject the left knee joint. There were no complications. A sterile bandage was applied.   Procedure note right knee injection verbal consent was obtained to inject right knee joint  Timeout was completed to confirm the site of injection  The medications used were 40 mg depomedrol and 3 cc of 1% lidocaine  Anesthesia was provided by ethyl chloride and the skin was prepped with alcohol.  After cleaning the skin with alcohol a 20-gauge needle was used to inject the right knee joint. There were no complications. A sterile bandage was applied.   Encounter Diagnosis  Name Primary?   Primary osteoarthritis of both knees Yes   Meds ordered this encounter  Medications   predniSONE (STERAPRED UNI-PAK 48 TAB) 10 MG (48) TBPK tablet    Sig: Take by mouth daily. 10 MG 12 DAYS AS DIRECTED    Dispense:  48 tablet    Refill:  0    REC WEIGHT LOSS

## 2021-10-18 ENCOUNTER — Other Ambulatory Visit: Payer: Self-pay | Admitting: Orthopedic Surgery

## 2021-10-18 DIAGNOSIS — M170 Bilateral primary osteoarthritis of knee: Secondary | ICD-10-CM

## 2021-12-01 ENCOUNTER — Telehealth: Payer: Self-pay | Admitting: Orthopedic Surgery

## 2021-12-01 NOTE — Telephone Encounter (Addendum)
Per voice message received 11:11am, asking about gel injection information. Please advise. ?

## 2021-12-01 NOTE — Telephone Encounter (Signed)
Patient called asking about gel injections. Per last ov note with Dr. Lemmie Evens, he recommended HA injections. I am unsure how to proceed with this. Thank you ?

## 2021-12-15 ENCOUNTER — Encounter: Payer: Self-pay | Admitting: Orthopedic Surgery

## 2021-12-15 ENCOUNTER — Ambulatory Visit (INDEPENDENT_AMBULATORY_CARE_PROVIDER_SITE_OTHER): Payer: BC Managed Care – PPO | Admitting: Orthopedic Surgery

## 2021-12-15 DIAGNOSIS — M170 Bilateral primary osteoarthritis of knee: Secondary | ICD-10-CM

## 2021-12-15 NOTE — Progress Notes (Signed)
FOLLOW UP  ? ?Encounter Diagnosis  ?Name Primary?  ? Primary osteoarthritis of both knees Yes  ? ? ? ?Chief Complaint  ?Patient presents with  ? Knee Pain  ?  Bilateral ?Orthovisc #1 bilateral knee  ? ? ? ?Procedure note for injection of hyaluronic acid  ? ?Diagnosis osteoarthritis of the knee ? ?Verbal consent was obtained to inject the knee with HYALURONIC ACID . Timeout was completed to confirm the injection site as the right  Knee ? ?Ethyl chloride spray was used for anesthesia ?Alcohol was used to prep the skin. The infrapatellar lateral portal was used as an injection site and 1 vial of hyaluronic acid  was injected into the knee ? ?Specific Co. Preparation: orthovisc ? ?No complications were noted ? ? ?Procedure note for injection of hyaluronic acid  ? ?Diagnosis osteoarthritis of the knee ? ?Verbal consent was obtained to inject the knee with HYALURONIC ACID . Timeout was completed to confirm the injection site as the left Knee ? ?Ethyl chloride spray was used for anesthesia ?Alcohol was used to prep the skin. The infrapatellar lateral portal was used as an injection site and 1 vial of hyaluronic acid  was injected into the knee ? ?Specific Co. Preparation: orthovisc ? ?No complications were noted ? ? ?The patient became diaphoretic after the second injection.  I advised that she get her injections on separate days she agreed ? ?We gave her water a cool towel over her head we placed her in the recumbent position and she recovered nicely and walked out normal ? ? ? ?

## 2021-12-22 ENCOUNTER — Ambulatory Visit (INDEPENDENT_AMBULATORY_CARE_PROVIDER_SITE_OTHER): Payer: BC Managed Care – PPO | Admitting: Orthopedic Surgery

## 2021-12-22 ENCOUNTER — Ambulatory Visit: Payer: BC Managed Care – PPO | Admitting: Orthopedic Surgery

## 2021-12-22 DIAGNOSIS — M170 Bilateral primary osteoarthritis of knee: Secondary | ICD-10-CM | POA: Diagnosis not present

## 2021-12-22 NOTE — Progress Notes (Signed)
FOLLOW UP  ? ?Encounter Diagnosis  ?Name Primary?  ? Primary osteoarthritis of both knees Yes  ? ? ? ?Chief Complaint  ?Patient presents with  ? Knee Pain  ?  Bilateral knee injections ?Orthovisc #2 bilat knees  ? ? ? ?Procedure note for injection of hyaluronic acid  ? ?Diagnosis osteoarthritis of the knee ? ?Verbal consent was obtained to inject the knee with HYALURONIC ACID . Timeout was completed to confirm the injection site as the right    knee ? ?Ethyl chloride spray was used for anesthesia ?Alcohol was used to prep the skin. The infrapatellar lateral portal was used as an injection site and 1 vial of hyaluronic acid  was injected into the knee ? ?Specific Co. Preparation: Orthovisc ? ?No complications were noted ? ?Procedure note for injection of hyaluronic acid  ? ?Diagnosis osteoarthritis of the knee ? ?Verbal consent was obtained to inject the knee with HYALURONIC ACID . Timeout was completed to confirm the injection site as the left   knee ? ?Ethyl chloride spray was used for anesthesia ?Alcohol was used to prep the skin. The infrapatellar lateral portal was used as an injection site and 1 vial of hyaluronic acid  was injected into the knee ? ?Specific Co. Preparation: Orthovisc ? ?No complications were noted  ? ?

## 2021-12-29 ENCOUNTER — Ambulatory Visit (INDEPENDENT_AMBULATORY_CARE_PROVIDER_SITE_OTHER): Payer: BC Managed Care – PPO | Admitting: Orthopedic Surgery

## 2021-12-29 DIAGNOSIS — M1712 Unilateral primary osteoarthritis, left knee: Secondary | ICD-10-CM | POA: Diagnosis not present

## 2021-12-29 DIAGNOSIS — M170 Bilateral primary osteoarthritis of knee: Secondary | ICD-10-CM

## 2021-12-29 MED ORDER — PREDNISONE 10 MG PO TABS: 10.0000 mg | ORAL_TABLET | Freq: Three times a day (TID) | ORAL | 5 refills | Status: DC

## 2021-12-29 NOTE — Progress Notes (Signed)
FOLLOW UP  ? ?Encounter Diagnosis  ?Name Primary?  ? Primary osteoarthritis of both knees Yes  ? ? ? ?Chief Complaint  ?Patient presents with  ? Injections  ?  Orthovisc- LEFT  ? ? ?Meds ordered this encounter  ?Medications  ? predniSONE (DELTASONE) 10 MG tablet  ?  Sig: Take 1 tablet (10 mg total) by mouth 3 (three) times daily.  ?  Dispense:  42 tablet  ?  Refill:  5  ? ? ?Procedure note for injection of hyaluronic acid  ? ?Diagnosis osteoarthritis of the knee ? ?Verbal consent was obtained to inject the knee with HYALURONIC ACID . Timeout was completed to confirm the injection site as the left    Knee ? ?Ethyl chloride spray was used for anesthesia ?Alcohol was used to prep the skin. The infrapatellar lateral portal was used as an injection site and 1 vial of hyaluronic acid  was injected into the knee ? ?Specific Co. Preparation: orthovisc ? ?No complications were noted  ?

## 2021-12-29 NOTE — Patient Instructions (Signed)
Physical therapy has been ordered for you at [facility]. They should call you to schedule, [phone] is the phone number to call, if you want to call to schedule.   

## 2021-12-31 ENCOUNTER — Ambulatory Visit (INDEPENDENT_AMBULATORY_CARE_PROVIDER_SITE_OTHER): Payer: BC Managed Care – PPO | Admitting: Orthopedic Surgery

## 2021-12-31 DIAGNOSIS — M1711 Unilateral primary osteoarthritis, right knee: Secondary | ICD-10-CM | POA: Diagnosis not present

## 2021-12-31 DIAGNOSIS — M170 Bilateral primary osteoarthritis of knee: Secondary | ICD-10-CM

## 2021-12-31 NOTE — Progress Notes (Signed)
Patient presents for injection right knee as she divided the hyaluronic injections secondary to getting dizzy when she got 2 injections at the same time ? ? ?Procedure note for injection of hyaluronic acid  ? ?Diagnosis osteoarthritis of the knee ? ?Verbal consent was obtained to inject the knee with HYALURONIC ACID . Timeout was completed to confirm the injection site as the right   knee ? ?Ethyl chloride spray was used for anesthesia ?Alcohol was used to prep the skin. The infrapatellar lateral portal was used as an injection site and 1 vial of hyaluronic acid  was injected into the knee ? ?Specific Co. Preparation: Orthovisc ? ?No complications were noted ? ? ? ?

## 2022-01-14 ENCOUNTER — Ambulatory Visit (HOSPITAL_COMMUNITY): Payer: BC Managed Care – PPO | Admitting: Physical Therapy

## 2022-02-16 ENCOUNTER — Ambulatory Visit (HOSPITAL_COMMUNITY): Payer: BC Managed Care – PPO | Attending: Orthopedic Surgery | Admitting: Physical Therapy

## 2022-02-16 ENCOUNTER — Encounter (HOSPITAL_COMMUNITY): Payer: Self-pay | Admitting: Physical Therapy

## 2022-02-16 DIAGNOSIS — M6281 Muscle weakness (generalized): Secondary | ICD-10-CM | POA: Diagnosis present

## 2022-02-16 DIAGNOSIS — M170 Bilateral primary osteoarthritis of knee: Secondary | ICD-10-CM | POA: Diagnosis not present

## 2022-02-16 DIAGNOSIS — R262 Difficulty in walking, not elsewhere classified: Secondary | ICD-10-CM | POA: Insufficient documentation

## 2022-02-16 DIAGNOSIS — M25561 Pain in right knee: Secondary | ICD-10-CM | POA: Diagnosis present

## 2022-02-16 DIAGNOSIS — M25562 Pain in left knee: Secondary | ICD-10-CM | POA: Diagnosis present

## 2022-02-16 DIAGNOSIS — G8929 Other chronic pain: Secondary | ICD-10-CM | POA: Insufficient documentation

## 2022-02-16 NOTE — Therapy (Signed)
OUTPATIENT PHYSICAL THERAPY LOWER EXTREMITY EVALUATION   Patient Name: Jodi Duran MRN: [id number] DOB:Mar 06, 1956, 66 y.o., female Today's Date: 02/16/2022   PT End of Session - 02/16/22 0835     Visit Number 1    Number of Visits 12    Date for PT Re-Evaluation 03/30/22    Authorization Type BCBS    Authorization - Visit Number 1    Progress Note Due on Visit 10    PT Start Time 0840    PT Stop Time 0915    PT Time Calculation (min) 35 min    Activity Tolerance Patient tolerated treatment well    Behavior During Therapy [facility] for tasks assessed/performed             Past Medical History:  Diagnosis Date   Arthritis    Hypertension    Past Surgical History:  Procedure Laterality Date   COLONOSCOPY  06/21/2007   QQV:ZDGLO internal hemorrhoids/Large amount of liquid stool seen in the transverse colon and descending colon which contained particulate matter   COLONOSCOPY N/A 07/11/2014   Procedure: COLONOSCOPY;  Surgeon: Danie Binder, MD;  [facility];  Service: Endoscopy;  Laterality: N/A;  10:30  - moved to 10:45 - Ginger to notify pt   Patient Active Problem List   Diagnosis Date Noted   Pain in left knee 11/18/2019   Obesity (BMI 30-39.9) 10/23/2019   COVID-19 virus detected 10/23/2019   Essential hypertension 05/21/2019   Baker's cyst of knee, left 05/21/2019   Swelling of limb 05/21/2019   Encounter for screening colonoscopy 06/18/2014   Corneal ulcer 02/11/2013   SHOULDER PAIN 11/13/2007   IMPINGEMENT SYNDROME 11/13/2007    PCP: Iona Beard  REFERRING PROVIDER: Arther Abbott   REFERRING DIAG: B knee pain  THERAPY DIAG:  Chronic pain of right knee  Chronic pain of left knee  Difficulty in walking, not elsewhere classified  Muscle weakness (generalized)  Rationale for Evaluation and Treatment Rehabilitation  ONSET DATE: chronic with acute exacerbation for 3 months.    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  Her knees have been bothering her for years.  Her Rt knee knee bothers her more than her left.  She has had three injections this year which has decreased her pain significantly, she is just weak.  She has one step at work which she has to use her arm to assist her to get up .    PERTINENT HISTORY: N/A  PAIN:  Are you having pain? Yes: NPRS scale: 4/10; worst 9/10; least 0/10 Pain location: B knee  Pain description: ache and throb Aggravating factors: sitting Relieving factors: meds   PRECAUTIONS: None  WEIGHT BEARING RESTRICTIONS No  FALLS:  Has patient fallen in last 6 months? Yes. 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  Works at [employer]: Independent  PATIENT GOALS Less pain, stronger., to be more stable and active.   OBJECTIVE:   DIAGNOSTIC FINDINGS: 1. Moderate to severe tricompartmental degenerative changes of the right knee with associated joint effusion. 2.  No acute displaced fracture or dislocation.    PATIENT SURVEYS:  FOTO 52  COGNITION:  Overall cognitive status:  Within functional limits for tasks assessed     SENSATION: WFL  Observation:  noted edema B; pt states she wears compression socks unsure of the Amount.  Advised to get 20-30 mm hg. POSTURE: No Significant postural limitations   LOWER EXTREMITY ROM:  Active ROM Right eval Left eval  Hip flexion    Hip extension    Hip abduction    Hip adduction    Hip internal rotation    Hip external rotation    Knee flexion 75 75  Knee extension -12 -5  Ankle dorsiflexion    Ankle plantarflexion    Ankle inversion    Ankle eversion     (Blank rows = not tested)  LOWER EXTREMITY MMT:  MMT Right eval  Left eval  Hip flexion 3+/5  4/5   Hip extension 3/5 3-/5   Hip abduction 4/5  3+/5   Hip adduction    Hip internal rotation    Hip external rotation    Knee flexion 4-/5 4-/5  Knee extension 3/5 3+/5   Ankle dorsiflexion 4/5 4+/5  Ankle plantarflexion    Ankle inversion    Ankle eversion     (Blank rows = not tested)  FUNCTIONAL TESTS:  30 seconds chair stand test 6 in 30 seconds  2 minute walk test: 274" Single leg stance:  Rt:  22"  LT: 22"    GAIT:  Comments: Decreased knee  and ankle motion with walking     TODAY'S TREATMENT: Evaluation: Sitting: LAQ x10 B Supine:  Quad set x 10 B                Heel slide x 5 B                SLR x 5 B    PATIENT EDUCATION:  Education details: Sitting: LAQ x10 B Supine:  Quad set x 10 B                Heel slide x 5 B                SLR x 5 B Person educated: Patient Education method: Explanation, Verbal cues, and Handouts Education comprehension: verbalized understanding and returned demonstration   HOME EXERCISE PROGRAM: HEP   ASSESSMENT:  CLINICAL IMPRESSION: Patient is a 66 y.o. female who was seen today for physical therapy evaluation and treatment for bilateral knee pain. The pt is having difficulty walking, going up steps, and balancing.  Evaluation demonstrates decreased ROM, decreased strength, decreased activity tolerance, decreased balance, increased pain and increased edema.  Mr. Funderburk will benefit from skilled physical therapy to address these issues and improve her functional ability.    OBJECTIVE IMPAIRMENTS Abnormal gait, decreased activity tolerance, decreased balance, difficulty walking, decreased ROM, decreased strength, increased edema, impaired flexibility, and pain.   ACTIVITY LIMITATIONS carrying, lifting, bending, sitting, squatting, stairs, and locomotion level  PARTICIPATION LIMITATIONS: cleaning, laundry, shopping, community activity, and occupation  [place] and Time  since onset of injury/illness/exacerbation are also affecting patient's functional outcome.   REHAB POTENTIAL: Good  CLINICAL DECISION MAKING: Stable/uncomplicated  EVALUATION COMPLEXITY: Moderate   GOALS: Goals reviewed with patient? Yes  SHORT TERM GOALS: Target date: 03/09/2022  PT to be I in HEP to improve B knee extension by 5 degrees  Baseline: Rt -12; Lt -5 Goal status: INITIAL  2.  PT knee flexion to be improved to 90 degrees to improve ease of sit to stand.  Baseline: 75 Goal  status: INITIAL  3.  PT LE and core strength to have increased 1/2 grade to allow pt to be able to complete 11 sit to stand in 30 seconds  Baseline:  Goal status: INITIAL    LONG TERM GOALS: Target date: 03/30/2022   PT to be I in and advanced HEP to decrease knee pain to no greater than a 5/10 throughout the day.  Baseline:  Goal status: INITIAL  2.  PT core and LE strength to be increased 1 grade to be able to ascend and descend 4 steps with one hand rail assist in a reciprocal manner. Baseline:  Goal status: INITIAL  3.  Pt ROM for B flexion to be to 115 to allow pt to squat to pick items off the floor with ease.  Baseline:  Goal status: INITIAL  4.  Pt to be able to single leg stance for 30 seconds B for decreased risk of falling  Baseline:  Goal status: INITIAL  5.  Pt to be completing a walking program 3x a week for improved health. Baseline:  Goal status: INITIAL PLAN: PT FREQUENCY: 2x/week MD requested 3, however, with work pt feels she can only make 2.   PT DURATION: 6 weeks  PLANNED INTERVENTIONS: Therapeutic exercises, Therapeutic activity, Balance training, Gait training, Patient/Family education, and Manual therapy  PLAN FOR NEXT SESSION: begin toe heel gait training, sitting heel slide to increase ROM, standing exercises for ROM, balance and strength.   Rayetta Humphrey, PT CLT [phone]  02/16/2022, 10:37 AM

## 2022-02-24 ENCOUNTER — Encounter (HOSPITAL_COMMUNITY): Payer: Self-pay

## 2022-02-24 ENCOUNTER — Ambulatory Visit (HOSPITAL_COMMUNITY): Payer: BC Managed Care – PPO

## 2022-02-24 DIAGNOSIS — G8929 Other chronic pain: Secondary | ICD-10-CM

## 2022-02-24 DIAGNOSIS — R262 Difficulty in walking, not elsewhere classified: Secondary | ICD-10-CM

## 2022-02-24 DIAGNOSIS — M6281 Muscle weakness (generalized): Secondary | ICD-10-CM

## 2022-02-24 DIAGNOSIS — M25561 Pain in right knee: Secondary | ICD-10-CM | POA: Diagnosis not present

## 2022-02-24 NOTE — Therapy (Signed)
OUTPATIENT PHYSICAL THERAPY LOWER EXTREMITY EVALUATION   Patient Name: Jodi Duran MRN: [id number] DOB:1956-06-28, 66 y.o., female Today's Date: 02/24/2022   PT End of Session - 02/24/22 1050     Visit Number 2    Number of Visits 12    Date for PT Re-Evaluation 03/30/22    Authorization Type BCBS    Authorization - Visit Number 2    Progress Note Due on Visit 10    PT Start Time 8299    PT Stop Time 1045    PT Time Calculation (min) 43 min    Activity Tolerance Patient tolerated treatment well    Behavior During Therapy WFL for tasks assessed/performed              Past Medical History:  Diagnosis Date   Arthritis    Hypertension    Past Surgical History:  Procedure Laterality Date   COLONOSCOPY  06/21/2007   BZJ:IRCVE internal hemorrhoids/Large amount of liquid stool seen in the transverse colon and descending colon which contained particulate matter   COLONOSCOPY N/A 07/11/2014   Procedure: COLONOSCOPY;  Surgeon: Danie Binder, MD;  Location[facility];  Service: Endoscopy;  Laterality: N/A;  10:30  - moved to 10:45 - Ginger to notify pt   Patient Active Problem List   Diagnosis Date Noted   Pain in left knee 11/18/2019   Obesity (BMI 30-39.9) 10/23/2019   COVID-19 virus detected 10/23/2019   Essential hypertension 05/21/2019   Baker's cyst of knee, left 05/21/2019   Swelling of limb 05/21/2019   Encounter for screening colonoscopy 06/18/2014   Corneal ulcer 02/11/2013   SHOULDER PAIN 11/13/2007   IMPINGEMENT SYNDROME 11/13/2007    PCP: Iona Beard  REFERRING PROVIDER: Arther Abbott   REFERRING DIAG: B knee pain  THERAPY DIAG:  Chronic pain of right knee  Chronic pain of left knee  Difficulty in walking, not elsewhere classified  Muscle weakness (generalized)  Rationale for Evaluation and Treatment Rehabilitation  ONSET DATE: chronic with acute exacerbation for 3 months.    SUBJECTIVE:   SUBJECTIVE STATEMENT: Pt reports she  is feeling good, stated it is pretty outside.  Does feel the weather plays a part with pain.  Has began the HEP without questions twice a week.  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  Her knees have been bothering her for years.  Her Rt knee knee bothers her more than her left.  She has had three injections this year which has decreased her pain significantly, she is just weak.  She has one step at work which she has to use her arm to assist her to get up.   PAIN:  Are you having pain? No NPRS scale:0/10 Pain location: B knee  Pain description: ache and throb Aggravating factors: sitting Relieving factors: meds   PERTINENT HISTORY: N/A  PAIN:  Are you having pain? Yes: NPRS scale: 4/10; worst 9/10; least 0/10 Pain location: B knee  Pain description: ache and throb Aggravating factors: sitting Relieving factors: meds   PRECAUTIONS: None  WEIGHT BEARING RESTRICTIONS No  FALLS:  Has patient fallen in last 6 months? Yes. 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   OBJECTIVE:   DIAGNOSTIC FINDINGS: 1. Moderate to severe tricompartmental degenerative changes of the right knee with associated joint effusion. 2.  No acute displaced fracture or dislocation.    PATIENT SURVEYS:  FOTO 52  COGNITION:  Overall cognitive status: Within functional limits for tasks assessed     SENSATION: WFL  Observation:  noted edema B; pt states she wears compression socks unsure of the Amount.  Advised to get 20-30 mm hg. POSTURE: No Significant postural limitations   LOWER EXTREMITY ROM:  Active ROM  Right eval Left eval  Hip flexion    Hip extension    Hip abduction    Hip adduction    Hip internal rotation    Hip external rotation    Knee flexion 75 75  Knee extension -12 -5  Ankle dorsiflexion    Ankle plantarflexion    Ankle inversion    Ankle eversion     (Blank rows = not tested)  LOWER EXTREMITY MMT:  MMT Right eval Left eval  Hip flexion 3+/5  4/5   Hip extension 3/5 3-/5   Hip abduction 4/5  3+/5   Hip adduction    Hip internal rotation    Hip external rotation    Knee flexion 4-/5 4-/5  Knee extension 3/5 3+/5   Ankle dorsiflexion 4/5 4+/5  Ankle plantarflexion    Ankle inversion    Ankle eversion     (Blank rows = not tested)  FUNCTIONAL TESTS:  30 seconds chair stand test 6 in 30 seconds  2 minute walk test: 274" Single leg stance:  Rt:  22"  LT: 22"    GAIT:  Comments: Decreased knee  and ankle motion with walking     TODAY'S TREATMENT: 02/24/22:  Reviewed goals, reviewed HEP per patient request with verbal and tactile cueing to improve quad activation and improve mobility with knee Supine: Quad set x 10 B 5" holds NMR tactile and verbal cueing to improve quad activation and reduce glut              Heel slide x 10 B              SLR x 10 B   SAQ 10 x 5"  Bridge 10x    Seated LAQ 10x    Heel slide with AROM then AAROM with opposite foot 5x 10" holds at end range  Standing: Gait training x 8 min to improve heel strike, knee flexion with toe push off    Evaluation: Sitting: LAQ x10 B Supine:  Quad set x 10 B                Heel slide x 5 B                SLR x 5 B    PATIENT EDUCATION:  Education details: Reviewed goals, educated importance of HEP compliance, pt able to recall HEP SLR and LAQ, required cueing to imporve quadricep activation and reduce compensation with gluteal mm Eval: HEP Person educated: Patient Education method: Explanation, Verbal cues, and Handouts Education comprehension: verbalized understanding and returned  demonstration   HOME EXERCISE PROGRAM: HEP   Access Code: [email or web address] URL: [email or web address] Date: 02/24/2022 Prepared by: Ihor Austin  Exercises - Supine Quad Set  - 1 x daily - 7 x weekly - 3 sets - 10 reps - Supine Heel Slides  -  1 x daily - 7 x weekly - 3 sets - 10 reps - Straight Leg Raise  - 1 x daily - 7 x weekly - 3 sets - 10 reps - Seated Knee Flexion Extension AROM   - 1 x daily - 7 x weekly - 3 sets - 10 reps - Seated Long Arc Quad  - 1 x daily - 7 x weekly - 3 sets - 10 reps - 5" hold  ASSESSMENT:  CLINICAL IMPRESSION: Patient is a 66 y.o. female who was seen today for physical therapy evaluation and treatment for bilateral knee pain. The pt is having difficulty walking, going up steps, and balancing.  Evaluation demonstrates decreased ROM, decreased strength, decreased activity tolerance, decreased balance, increased pain and increased edema.  Mr. Senk will benefit from skilled physical therapy to address these issues and improve her functional ability.   Reviewed goals, educated importance of HEP compliance and encouraged increased frequency for maximal benefits.  Pt required cueing for proper mechanics with quad sets, tendency to compensate with gluteal activation.  Added SAQ to improve quad activation.  Added seated heel slides with AAROM to improve flexion.  Gait training to improve heel strike and knee flexion with toe push off that did required min to moderate cueing.     OBJECTIVE IMPAIRMENTS Abnormal gait, decreased activity tolerance, decreased balance, difficulty walking, decreased ROM, decreased strength, increased edema, impaired flexibility, and pain.   ACTIVITY LIMITATIONS carrying, lifting, bending, sitting, squatting, stairs, and locomotion level  PARTICIPATION LIMITATIONS: cleaning, laundry, shopping, community activity, and occupation  [place] and Time since onset of injury/illness/exacerbation are also affecting  patient's functional outcome.   REHAB POTENTIAL: Good  CLINICAL DECISION MAKING: Stable/uncomplicated  EVALUATION COMPLEXITY: Moderate   GOALS: Goals reviewed with patient? Yes  SHORT TERM GOALS: Target date: 03/17/2022  PT to be I in HEP to improve B knee extension by 5 degrees  Baseline: Rt -12; Lt -5 Goal status: IN PROGRESS  2.  PT knee flexion to be improved to 90 degrees to improve ease of sit to stand.  Baseline: 75 Goal status: IN PROGRESS  3.  PT LE and core strength to have increased 1/2 grade to allow pt to be able to complete 11 sit to stand in 30 seconds  Baseline:  Goal status: IN PROGRESS    LONG TERM GOALS: Target date: 04/07/2022   PT to be I in and advanced HEP to decrease knee pain to no greater than a 5/10 throughout the day.  Baseline:  Goal status: IN PROGRESS  2.  PT core and LE strength to be increased 1 grade to be able to ascend and descend 4 steps with one hand rail assist in a reciprocal manner. Baseline:  Goal status: IN PROGRESS  3.  Pt ROM for B flexion to be to 115 to allow pt to squat to pick items off the floor with ease.  Baseline:  Goal status: IN PROGRESS  4.  Pt to be able to single leg stance for 30 seconds B for decreased risk of falling  Baseline:  Goal status: IN PROGRESS  5.  Pt to be completing a walking program 3x a week for improved health. Baseline:  Goal status: IN PROGRESS PLAN: PT FREQUENCY: 2x/week MD requested 3, however, with work pt feels she can only make 2.   PT DURATION: 6 weeks  PLANNED INTERVENTIONS: Therapeutic exercises, Therapeutic activity, Balance training, Gait training, Patient/Family education, and Manual therapy  PLAN FOR NEXT SESSION: begin  toe heel gait training, sitting heel slide to increase ROM, standing exercises for ROM, balance and strength.   Ihor Austin, LPTA/CLT; Delana Meyer [phone]  02/24/2022, 10:37 AM

## 2022-02-28 ENCOUNTER — Ambulatory Visit: Payer: BC Managed Care – PPO | Admitting: Orthopedic Surgery

## 2022-03-02 ENCOUNTER — Encounter (HOSPITAL_COMMUNITY): Payer: BC Managed Care – PPO

## 2022-03-02 ENCOUNTER — Telehealth (HOSPITAL_COMMUNITY): Payer: Self-pay

## 2022-03-02 NOTE — Telephone Encounter (Signed)
No show, called and left message concerning missed apt today.  Reminded next apt date and time with contact number included if needs to cancel/reschedule future apts.      Ihor Austin, LPTA/CLT; Delana Meyer [phone]

## 2022-03-04 ENCOUNTER — Ambulatory Visit (HOSPITAL_COMMUNITY): Payer: BC Managed Care – PPO

## 2022-03-04 ENCOUNTER — Telehealth (HOSPITAL_COMMUNITY): Payer: Self-pay

## 2022-03-09 ENCOUNTER — Ambulatory Visit (HOSPITAL_COMMUNITY): Payer: BC Managed Care – PPO | Admitting: Physical Therapy

## 2022-03-09 DIAGNOSIS — R262 Difficulty in walking, not elsewhere classified: Secondary | ICD-10-CM

## 2022-03-09 DIAGNOSIS — M25561 Pain in right knee: Secondary | ICD-10-CM | POA: Diagnosis not present

## 2022-03-09 DIAGNOSIS — G8929 Other chronic pain: Secondary | ICD-10-CM

## 2022-03-09 DIAGNOSIS — M6281 Muscle weakness (generalized): Secondary | ICD-10-CM

## 2022-03-09 NOTE — Therapy (Signed)
OUTPATIENT PHYSICAL THERAPY LOWER EXTREMITY EVALUATION   Patient Name: Jodi Duran MRN: [id number] DOB:20-Sep-1955, 66 y.o., female Today's Date: 03/09/2022   PT End of Session - 03/09/22 0919     Visit Number 3    Number of Visits 12    Date for PT Re-Evaluation 03/30/22    Authorization Type BCBS    Progress Note Due on Visit 10    PT Start Time 0920    PT Stop Time 1004    PT Time Calculation (min) 44 min    Activity Tolerance Patient tolerated treatment well              Past Medical History:  Diagnosis Date   Arthritis    Hypertension    Past Surgical History:  Procedure Laterality Date   COLONOSCOPY  06/21/2007   YOV:[id number] internal hemorrhoids/Large amount of liquid stool seen in the transverse colon and descending colon which contained particulate matter   COLONOSCOPY N/A 07/11/2014   Procedure: COLONOSCOPY;  Surgeon: Danie Binder, MD;  [facility];  Service: Endoscopy;  Laterality: N/A;  10:30  - moved to 10:45 - Ginger to notify pt   Patient Active Problem List   Diagnosis Date Noted   Pain in left knee 11/18/2019   Obesity (BMI 30-39.9) 10/23/2019   COVID-19 virus detected 10/23/2019   Essential hypertension 05/21/2019   Baker's cyst of knee, left 05/21/2019   Swelling of limb 05/21/2019   Encounter for screening colonoscopy 06/18/2014   Corneal ulcer 02/11/2013   SHOULDER PAIN 11/13/2007   IMPINGEMENT SYNDROME 11/13/2007    PCP: Iona Beard  REFERRING PROVIDER: Arther Abbott   REFERRING DIAG: B knee pain  THERAPY DIAG:  Chronic pain of right knee  Chronic pain of left knee  Difficulty in walking, not elsewhere classified  Muscle weakness (generalized)  Rationale for Evaluation and Treatment Rehabilitation  ONSET DATE: chronic with acute exacerbation for 3 months.    SUBJECTIVE:   SUBJECTIVE STATEMENT: Pt states that everything is easier, getting out of a chair, in and out of a car.  PAIN:  Are you having  pain? No NPRS scale:0/10 Pain location: B knee  Pain description: ache and throb Aggravating factors: sitting Relieving factors: meds   PERTINENT HISTORY: N/A  PAIN:  Are you having pain? Yes: NPRS scale: 4/10; worst 9/10; least 0/10 Pain location: B knee  Pain description: ache and throb Aggravating factors: sitting Relieving factors: meds   PRECAUTIONS: None  WEIGHT BEARING RESTRICTIONS No  FALLS:  Has patient fallen in last 6 months? Yes. 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  Works at [employer]: Independent  PATIENT GOALS Less pain, stronger., to be more stable and active.   OBJECTIVE:   DIAGNOSTIC FINDINGS: 1. Moderate to severe tricompartmental degenerative changes of the right knee with associated joint effusion. 2.  No acute displaced fracture or dislocation.    PATIENT SURVEYS:  FOTO 9  WFL  Observation:  noted edema B; pt states she wears compression socks unsure of the Amount.  Advised to get 20-30 mm hg.  LOWER EXTREMITY ROM:  Active ROM Right Eval 6/7 RT 6/28 Left Eval 6/7 LT 6/28  Hip flexion      Hip extension      Hip  abduction      Hip adduction      Hip internal rotation      Hip external rotation      Knee flexion 75 82 75 85  Knee extension -12 -9 -5 -5( with significant encouragement)  Ankle dorsiflexion      Ankle plantarflexion      Ankle inversion      Ankle eversion       (Blank rows = not tested)  LOWER EXTREMITY MMT:  MMT Right eval Left eval  Hip flexion 3+/5  4/5   Hip extension 3/5 3-/5   Hip abduction 4/5  3+/5   Hip adduction    Hip internal rotation    Hip external rotation    Knee flexion 4-/5 4-/5  Knee extension 3/5 3+/5   Ankle dorsiflexion 4/5 4+/5  Ankle plantarflexion    Ankle  inversion    Ankle eversion     (Blank rows = not tested)  FUNCTIONAL TESTS:  30 seconds chair stand test 6 in 30 seconds  2 minute walk test: 274" Single leg stance:  Rt:  22"  LT: 22"    GAIT:  Comments: Decreased knee  and ankle motion with walking     TODAY'S TREATMENT: 03/09/22             Standing:             Terminal extension with ball at wall RT and then LT x 10 each              Sitting:             Hip isometric adduction x10             LAQ B 10 hold 5" pull back and hold into flexion for 3"              Ankle pumps x 10             Supine:            Quad set B x 10 for 5"            Heelslide x 10 B            Bridge x 10             SLR x 10 B  02/24/22:  Reviewed goals, reviewed HEP per patient request with verbal and tactile cueing to improve quad activation and improve mobility with knee Supine: Quad set x 10 B 5" holds NMR tactile and verbal cueing to improve quad activation and reduce glut              Heel slide x 10 B              SLR x 10 B   SAQ 10 x 5"  Bridge 10x    Seated LAQ 10x    Heel slide with AROM then AAROM with opposite foot 5x 10" holds at end range  Standing: Gait training x 8 min to improve heel strike, knee flexion with toe push off    Evaluation: 02/16/22 Sitting: LAQ x10 B Supine:  Quad set x 10 B                Heel slide x 5 B                SLR x 5 B    PATIENT EDUCATION:  Education details: Reviewed goals, educated importance of HEP  compliance, pt able to recall HEP SLR and LAQ, required cueing to imporve quadricep activation and reduce compensation with gluteal mm Eval: HEP Person educated: Patient Education method: Explanation, Verbal cues, and Handouts Education comprehension: verbalized understanding and returned demonstration   HOME EXERCISE PROGRAM: HEP  6/28:   Sitting ankle pumps, hip isometric adduction Supine:  SAQ, bridge   Access Code: [email or web address] URL: [email or web address] Date:  02/24/2022 Prepared by: Ihor Austin  Exercises - Supine Quad Set  - 1 x daily - 7 x weekly - 3 sets - 10 reps - Supine Heel Slides  - 1 x daily - 7 x weekly - 3 sets - 10 reps - Straight Leg Raise  - 1 x daily - 7 x weekly - 3 sets - 10 reps - Seated Knee Flexion Extension AROM   - 1 x daily - 7 x weekly - 3 sets - 10 reps - Seated Long Arc Quad  - 1 x daily - 7 x weekly - 3 sets - 10 reps - 5" hold  ASSESSMENT:  CLINICAL IMPRESSION: Pt improving in ROM and strength but still has significant limitations.  Pt needs verbal cuing to prevent substitution patterns when completing exercises.  Pt will continue to benefit from skilled PT to improve ROM, strength and balance to improve functional ability.   OBJECTIVE IMPAIRMENTS Abnormal gait, decreased activity tolerance, decreased balance, difficulty walking, decreased ROM, decreased strength, increased edema, impaired flexibility, and pain.   ACTIVITY LIMITATIONS carrying, lifting, bending, sitting, squatting, stairs, and locomotion level  PARTICIPATION LIMITATIONS: cleaning, laundry, shopping, community activity, and occupation  [place] and Time since onset of injury/illness/exacerbation are also affecting patient's functional outcome.   REHAB POTENTIAL: Good  CLINICAL DECISION MAKING: Stable/uncomplicated  EVALUATION COMPLEXITY: Moderate   GOALS: Goals reviewed with patient? Yes  SHORT TERM GOALS: Target date: 03/30/2022  PT to be I in HEP to improve B knee extension by 5 degrees  Baseline: Rt -12; Lt -5 Goal status: IN PROGRESS  2.  PT knee flexion to be improved to 90 degrees to improve ease of sit to stand.  Baseline: 75 Goal status: IN PROGRESS  3.  PT LE and core strength to have increased 1/2 grade to allow pt to be able to complete 11 sit to stand in 30 seconds  Baseline:  Goal status: IN PROGRESS    LONG TERM GOALS: Target date: 04/20/2022   PT to be I in and advanced HEP to decrease knee pain  to no greater than a 5/10 throughout the day.  Baseline:  Goal status: IN PROGRESS  2.  PT core and LE strength to be increased 1 grade to be able to ascend and descend 4 steps with one hand rail assist in a reciprocal manner. Baseline:  Goal status: IN PROGRESS  3.  Pt ROM for B flexion to be to 115 to allow pt to squat to pick items off the floor with ease.  Baseline:  Goal status: IN PROGRESS  4.  Pt to be able to single leg stance for 30 seconds B for decreased risk of falling  Baseline:  Goal status: IN PROGRESS  5.  Pt to be completing a walking program 3x a week for improved health. Baseline:  Goal status: IN PROGRESS PLAN: PT FREQUENCY: 2x/week MD requested 3, however, with work pt feels she can only make 2.   PT DURATION: 6 weeks  PLANNED INTERVENTIONS: Therapeutic exercises, Therapeutic activity, Balance training, Gait training, Patient/Family education, and Manual therapy  PLAN FOR NEXT SESSION: begin toe heel gait training, sitting heel slide to increase ROM, standing exercises for ROM, balance and strength.   Rayetta Humphrey, PT CLT  [phone]  03/09/2022, 10:37 AM

## 2022-03-11 ENCOUNTER — Ambulatory Visit (HOSPITAL_COMMUNITY): Payer: BC Managed Care – PPO

## 2022-03-11 ENCOUNTER — Encounter (HOSPITAL_COMMUNITY): Payer: Self-pay

## 2022-03-11 DIAGNOSIS — R262 Difficulty in walking, not elsewhere classified: Secondary | ICD-10-CM

## 2022-03-11 DIAGNOSIS — M25561 Pain in right knee: Secondary | ICD-10-CM | POA: Diagnosis not present

## 2022-03-11 DIAGNOSIS — G8929 Other chronic pain: Secondary | ICD-10-CM

## 2022-03-11 DIAGNOSIS — M6281 Muscle weakness (generalized): Secondary | ICD-10-CM

## 2022-03-11 NOTE — Therapy (Signed)
OUTPATIENT PHYSICAL THERAPY LOWER EXTREMITY TREATMENT   Patient Name: Jodi Duran MRN: [id number] DOB:Feb 01, 1956, 66 y.o., female Today's Date: 03/11/2022   PT End of Session - 03/11/22 1101     Visit Number 4    Number of Visits 12    Date for PT Re-Evaluation 03/30/22    Authorization Type BCBS    Authorization - Visit Number 3    Progress Note Due on Visit 10    PT Start Time 1055   late sign in   PT Stop Time 1133    PT Time Calculation (min) 38 min    Activity Tolerance Patient tolerated treatment well    Behavior During Therapy [facility] for tasks assessed/performed               Past Medical History:  Diagnosis Date   Arthritis    Hypertension    Past Surgical History:  Procedure Laterality Date   COLONOSCOPY  06/21/2007   JHE:RDEYC internal hemorrhoids/Large amount of liquid stool seen in the transverse colon and descending colon which contained particulate matter   COLONOSCOPY N/A 07/11/2014   Procedure: COLONOSCOPY;  Surgeon: Danie Binder, MD;  [facility];  Service: Endoscopy;  Laterality: N/A;  10:30  - moved to 10:45 - Ginger to notify pt   Patient Active Problem List   Diagnosis Date Noted   Pain in left knee 11/18/2019   Obesity (BMI 30-39.9) 10/23/2019   COVID-19 virus detected 10/23/2019   Essential hypertension 05/21/2019   Baker's cyst of knee, left 05/21/2019   Swelling of limb 05/21/2019   Encounter for screening colonoscopy 06/18/2014   Corneal ulcer 02/11/2013   SHOULDER PAIN 11/13/2007   IMPINGEMENT SYNDROME 11/13/2007    PCP: Iona Beard  REFERRING PROVIDER: Arther Abbott   REFERRING DIAG: B knee pain  THERAPY DIAG:  Chronic pain of right knee  Chronic pain of left knee  Difficulty in walking, not elsewhere classified  Muscle weakness (generalized)  Rationale for Evaluation and Treatment Rehabilitation  ONSET DATE: chronic with acute exacerbation for 3 months.    SUBJECTIVE:   SUBJECTIVE  STATEMENT: Pt stated she is feeling improvements with no reports of pain.  Reports feels her knees are moving better PAIN:  Are you having pain? No NPRS scale:0/10 Pain location: B knee  Pain description: ache and throb Aggravating factors: sitting Relieving factors: meds   PERTINENT HISTORY: N/A  PAIN:  Are you having pain? Yes: NPRS scale: 4/10; worst 9/10; least 0/10 Pain location: B knee  Pain description: ache and throb Aggravating factors: sitting Relieving factors: meds   PRECAUTIONS: None  WEIGHT BEARING RESTRICTIONS No  FALLS:  Has patient fallen in last 6 months? Yes. 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  Works at [employer]: Independent  PATIENT GOALS Less pain, stronger., to be more stable and active.   OBJECTIVE:   DIAGNOSTIC FINDINGS: 1. Moderate to severe tricompartmental degenerative changes of the right knee with associated joint effusion. 2.  No acute displaced fracture or dislocation.    PATIENT SURVEYS:  FOTO 57  WFL  Observation:  noted edema B; pt states she wears compression socks unsure of the Amount.  Advised to get 20-30 mm hg.  LOWER EXTREMITY ROM:  Active ROM Right Eval  6/7 RT 6/28 Left Eval 6/7 LT 6/28 Lt  03/11/22 Rt  03/11/22  /Hip flexion        Hip extension        Hip abduction        Hip adduction        Hip internal rotation        Hip external rotation        Knee flexion 75 82 75 85 88 85  Knee extension -12 -9 -5 -5( with significant encouragement) 8 degrees lacking 8 degrees lacking  Ankle dorsiflexion        Ankle plantarflexion        Ankle inversion        Ankle eversion         (Blank rows = not tested)  LOWER EXTREMITY MMT:  MMT Right eval Left eval  Hip flexion 3+/5  4/5   Hip  extension 3/5 3-/5   Hip abduction 4/5  3+/5   Hip adduction    Hip internal rotation    Hip external rotation    Knee flexion 4-/5 4-/5  Knee extension 3/5 3+/5   Ankle dorsiflexion 4/5 4+/5  Ankle plantarflexion    Ankle inversion    Ankle eversion     (Blank rows = not tested)  FUNCTIONAL TESTS:  30 seconds chair stand test 6 in 30 seconds  2 minute walk test: 274" Single leg stance:  Rt:  22"  LT: 22"    GAIT:  Comments: Decreased knee  and ankle motion with walking     TODAY'S TREATMENT: 03/11/22: Standing: gait training for heel to toe mechanics 257f   Heel raise incline slope   Toe raise decline slope   Knee drive 101BPstep height 5x 10"   Hamstring stretch 5x 20"   TKE 10 x5 " BLE  Sitting:   Heel slide for knee flexion 5x 10" AAROM end range  Supine: Bridge 10x 03/09/22             Standing:             Terminal extension with ball at wall RT and then LT x 10 each              Sitting:             Hip isometric adduction x10             LAQ B 10 hold 5" pull back and hold into flexion for 3"              Ankle pumps x 10             Supine:            Quad set B x 10 for 5"            Heelslide x 10 B            Bridge x 10             SLR x 10 B  02/24/22:  Reviewed goals, reviewed HEP per patient request with verbal and tactile cueing to improve quad activation and improve mobility with knee Supine: Quad set x 10 B 5" holds NMR tactile and verbal cueing to improve quad activation and reduce glut              Heel slide x 10 B              SLR x 10  B   SAQ 10 x 5"  Bridge 10x    Seated LAQ 10x    Heel slide with AROM then AAROM with opposite foot 5x 10" holds at end range  Standing: Gait training x 8 min to improve heel strike, knee flexion with toe push off    Evaluation: 02/16/22 Sitting: LAQ x10 B Supine:  Quad set x 10 B                Heel slide x 5 B                SLR x 5 B    PATIENT EDUCATION:  Education details: Reviewed goals,  educated importance of HEP compliance, pt able to recall HEP SLR and LAQ, required cueing to imporve quadricep activation and reduce compensation with gluteal mm Eval: HEP Person educated: Patient Education method: Explanation, Verbal cues, and Handouts Education comprehension: verbalized understanding and returned demonstration   HOME EXERCISE PROGRAM: HEP  6/28:   Sitting ankle pumps, hip isometric adduction Supine:  SAQ, bridge   Access Code: [email or web address] URL: [email or web address] Date: 02/24/2022 Prepared by: Ihor Austin  Exercises - Supine Quad Set  - 1 x daily - 7 x weekly - 3 sets - 10 reps - Supine Heel Slides  - 1 x daily - 7 x weekly - 3 sets - 10 reps - Straight Leg Raise  - 1 x daily - 7 x weekly - 3 sets - 10 reps - Seated Knee Flexion Extension AROM   - 1 x daily - 7 x weekly - 3 sets - 10 reps - Seated Long Arc Quad  - 1 x daily - 7 x weekly - 3 sets - 10 reps - 5" hold  ASSESSMENT:  CLINICAL IMPRESSION: Pt improving in ROM and strength but still has significant limitations.  Pt needs verbal cuing to prevent substitution patterns when completing exercises.  Pt will continue to benefit from skilled PT to improve ROM, strength and balance to improve functional ability.   Session focus improving ROM and strength.  Gait training to improve heel to toe mechanics and improve knee mobility, cueing to improve toe push off to improve knee flexion.  Pt required verbal cueing to prevent compensation while completing standing exercises.  ROM measurements taken, improving BLE.    OBJECTIVE IMPAIRMENTS Abnormal gait, decreased activity tolerance, decreased balance, difficulty walking, decreased ROM, decreased strength, increased edema, impaired flexibility, and pain.   ACTIVITY LIMITATIONS carrying, lifting, bending, sitting, squatting, stairs, and locomotion level  PARTICIPATION LIMITATIONS: cleaning, laundry, shopping, community activity, and  occupation  [place] and Time since onset of injury/illness/exacerbation are also affecting patient's functional outcome.   REHAB POTENTIAL: Good  CLINICAL DECISION MAKING: Stable/uncomplicated  EVALUATION COMPLEXITY: Moderate   GOALS: Goals reviewed with patient? Yes  SHORT TERM GOALS: Target date: 04/01/2022  PT to be I in HEP to improve B knee extension by 5 degrees  Baseline: Rt -12; Lt -5 Goal status: IN PROGRESS  2.  PT knee flexion to be improved to 90 degrees to improve ease of sit to stand.  Baseline: 75 Goal status: IN PROGRESS  3.  PT LE and core strength to have increased 1/2 grade to allow pt to be able to complete 11 sit to stand in 30 seconds  Baseline:  Goal status: IN PROGRESS    LONG TERM GOALS: Target date: 04/22/2022   PT to be I in and advanced HEP to decrease knee pain to no greater than  a 5/10 throughout the day.  Baseline:  Goal status: IN PROGRESS  2.  PT core and LE strength to be increased 1 grade to be able to ascend and descend 4 steps with one hand rail assist in a reciprocal manner. Baseline:  Goal status: IN PROGRESS  3.  Pt ROM for B flexion to be to 115 to allow pt to squat to pick items off the floor with ease.  Baseline:  Goal status: IN PROGRESS  4.  Pt to be able to single leg stance for 30 seconds B for decreased risk of falling  Baseline:  Goal status: IN PROGRESS  5.  Pt to be completing a walking program 3x a week for improved health. Baseline:  Goal status: IN PROGRESS PLAN: PT FREQUENCY: 2x/week MD requested 3, however, with work pt feels she can only make 2.   PT DURATION: 6 weeks  PLANNED INTERVENTIONS: Therapeutic exercises, Therapeutic activity, Balance training, Gait training, Patient/Family education, and Manual therapy  PLAN FOR NEXT SESSION: Review arm swing and heel to toe gait training, sitting heel slide to increase ROM, standing exercises for ROM, balance and strength.   Ihor Austin,  LPTA/CLT; Delana Meyer [phone]   03/11/2022, 10:37 AM

## 2022-03-16 ENCOUNTER — Encounter (HOSPITAL_COMMUNITY): Payer: BC Managed Care – PPO

## 2022-03-18 ENCOUNTER — Ambulatory Visit (HOSPITAL_COMMUNITY): Payer: BC Managed Care – PPO | Attending: Orthopedic Surgery | Admitting: Physical Therapy

## 2022-03-18 ENCOUNTER — Ambulatory Visit: Payer: BC Managed Care – PPO | Admitting: Orthopedic Surgery

## 2022-03-18 DIAGNOSIS — M25562 Pain in left knee: Secondary | ICD-10-CM | POA: Diagnosis present

## 2022-03-18 DIAGNOSIS — R262 Difficulty in walking, not elsewhere classified: Secondary | ICD-10-CM | POA: Diagnosis present

## 2022-03-18 DIAGNOSIS — G8929 Other chronic pain: Secondary | ICD-10-CM | POA: Insufficient documentation

## 2022-03-18 DIAGNOSIS — M25561 Pain in right knee: Secondary | ICD-10-CM | POA: Insufficient documentation

## 2022-03-18 DIAGNOSIS — M6281 Muscle weakness (generalized): Secondary | ICD-10-CM | POA: Diagnosis present

## 2022-03-18 NOTE — Therapy (Signed)
OUTPATIENT PHYSICAL THERAPY LOWER EXTREMITY TREATMENT   Patient Name: Jodi Duran MRN: [id number] DOB:1956/07/25, 66 y.o., female Today's Date: 03/18/2022   PT End of Session - 03/18/22 0910     Visit Number 5    Number of Visits 12    Date for PT Re-Evaluation 03/30/22    Authorization Type BCBS    Authorization - Visit Number 4    Progress Note Due on Visit 10    PT Start Time 0905    PT Stop Time 0945    PT Time Calculation (min) 40 min    Activity Tolerance Patient tolerated treatment well    Behavior During Therapy [facility] for tasks assessed/performed               Past Medical History:  Diagnosis Date   Arthritis    Hypertension    Past Surgical History:  Procedure Laterality Date   COLONOSCOPY  06/21/2007   WIO:XBDZH internal hemorrhoids/Large amount of liquid stool seen in the transverse colon and descending colon which contained particulate matter   COLONOSCOPY N/A 07/11/2014   Procedure: COLONOSCOPY;  Surgeon: Danie Binder, MD;  [facility];  Service: Endoscopy;  Laterality: N/A;  10:30  - moved to 10:45 - Ginger to notify pt   Patient Active Problem List   Diagnosis Date Noted   Pain in left knee 11/18/2019   Obesity (BMI 30-39.9) 10/23/2019   COVID-19 virus detected 10/23/2019   Essential hypertension 05/21/2019   Baker's cyst of knee, left 05/21/2019   Swelling of limb 05/21/2019   Encounter for screening colonoscopy 06/18/2014   Corneal ulcer 02/11/2013   SHOULDER PAIN 11/13/2007   IMPINGEMENT SYNDROME 11/13/2007    PCP: Iona Beard  REFERRING PROVIDER: Arther Abbott   REFERRING DIAG: B knee pain  THERAPY DIAG:  No diagnosis found.  Rationale for Evaluation and Treatment Rehabilitation  ONSET DATE: chronic with acute exacerbation for 3 months.    SUBJECTIVE:   SUBJECTIVE STATEMENT: Pt stated she is feeling improvements with no reports of pain.  Reports she uses her leg bike at home.  PAIN:  Are you having pain?  No NPRS scale:0/10 Pain location: B knee  Pain description: ache and throb Aggravating factors: sitting Relieving factors: meds   PERTINENT HISTORY: N/A  PAIN:  Are you having pain? Yes: NPRS scale: 4/10; worst 9/10; least 0/10 Pain location: B knee  Pain description: ache and throb Aggravating factors: sitting Relieving factors: meds   PRECAUTIONS: None  WEIGHT BEARING RESTRICTIONS No  FALLS:  Has patient fallen in last 6 months? Yes. 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  Works at [employer]: Independent  PATIENT GOALS Less pain, stronger., to be more stable and active.   OBJECTIVE:   DIAGNOSTIC FINDINGS: 1. Moderate to severe tricompartmental degenerative changes of the right knee with associated joint effusion. 2.  No acute displaced fracture or dislocation.    PATIENT SURVEYS:  FOTO 27  WFL  Observation:  noted edema B; pt states she wears compression socks unsure of the Amount.  Advised to get 20-30 mm hg.  LOWER EXTREMITY ROM:  Active ROM Right Eval 6/7 RT 6/28 Left Eval 6/7 LT 6/28 Lt  03/11/22 Rt  03/11/22  /Hip flexion  Hip extension        Hip abduction        Hip adduction        Hip internal rotation        Hip external rotation        Knee flexion 75 82 75 85 88 85  Knee extension -12 -9 -5 -5( with significant encouragement) 8 degrees lacking 8 degrees lacking  Ankle dorsiflexion        Ankle plantarflexion        Ankle inversion        Ankle eversion         (Blank rows = not tested)  LOWER EXTREMITY MMT:  MMT Right eval Left eval  Hip flexion 3+/5  4/5   Hip extension 3/5 3-/5   Hip abduction 4/5  3+/5   Hip adduction    Hip internal rotation    Hip external rotation    Knee flexion 4-/5 4-/5  Knee  extension 3/5 3+/5   Ankle dorsiflexion 4/5 4+/5  Ankle plantarflexion    Ankle inversion    Ankle eversion     (Blank rows = not tested)  FUNCTIONAL TESTS:  30 seconds chair stand test 6 in 30 seconds  2 minute walk test: 274" Single leg stance:  Rt:  22"  LT: 22"    GAIT:  Comments: Decreased knee  and ankle motion with walking     TODAY'S TREATMENT: 03/18/22 Ambulation around clinic 200 ft no AD working on heel-toe, knee flexion, UE swing Standing: Heel raise on incline 20X  Toe raises on incline 20X  Knee drive flexion stretch bilaterally 5X10" on 12" step  Hamstring stretch bilaterally on 12" step 5X20"  Slantboard stretch 3X20"  TKE with bodycraft pulleys 3plates 15X5" each Seated: heelslides for knee flexion 10X10" with AAROM end range   LAQ 10X5" Supine: Bridge 2 sets 10 reps  SLR 10X each  03/11/22: Standing: gait training for heel to toe mechanics 279f   Heel raise incline slope   Toe raise decline slope   Knee drive [id number] height 5x 10"   Hamstring stretch 5x 20"   TKE 10 x5 " BLE  Sitting:   Heel slide for knee flexion 5x 10" AAROM end range  Supine: Bridge 10x  03/09/22             Standing:             Terminal extension with ball at wall RT and then LT x 10 each              Sitting:             Hip isometric adduction x10             LAQ B 10 hold 5" pull back and hold into flexion for 3"              Ankle pumps x 10             Supine:            Quad set B x 10 for 5"            Heelslide x 10 B            Bridge x 10             SLR x 10 B   02/24/22:  Reviewed goals, reviewed HEP per patient request with verbal and tactile cueing to improve quad  activation and improve mobility with knee Supine: Quad set x 10 B 5" holds NMR tactile and verbal cueing to improve quad activation and reduce glut              Heel slide x 10 B              SLR x 10 B   SAQ 10 x 5"  Bridge 10x    Seated LAQ 10x    Heel slide with AROM then AAROM with  opposite foot 5x 10" holds at end range  Standing: Gait training x 8 min to improve heel strike, knee flexion with toe push off    Evaluation: 02/16/22 Sitting: LAQ x10 B Supine:  Quad set x 10 B                Heel slide x 5 B                SLR x 5 B    PATIENT EDUCATION:  Education details: Reviewed goals, educated importance of HEP compliance, pt able to recall HEP SLR and LAQ, required cueing to imporve quadricep activation and reduce compensation with gluteal mm Eval: HEP Person educated: Patient Education method: Explanation, Verbal cues, and Handouts Education comprehension: verbalized understanding and returned demonstration   HOME EXERCISE PROGRAM: HEP  6/28:   Sitting ankle pumps, hip isometric adduction Supine:  SAQ, bridge   Access Code: [email or web address] URL: [email or web address] Date: 02/24/2022 Prepared by: Ihor Austin  Exercises - Supine Quad Set  - 1 x daily - 7 x weekly - 3 sets - 10 reps - Supine Heel Slides  - 1 x daily - 7 x weekly - 3 sets - 10 reps - Straight Leg Raise  - 1 x daily - 7 x weekly - 3 sets - 10 reps - Seated Knee Flexion Extension AROM   - 1 x daily - 7 x weekly - 3 sets - 10 reps - Seated Long Arc Quad  - 1 x daily - 7 x weekly - 3 sets - 10 reps - 5" hold  ASSESSMENT:  CLINICAL IMPRESSION: Began session with ambulation around clinic with cues to increase knee flexion, heel to toe and UE swing with ambulation.  Added standing hamstring and gastroc stretch stretch today to work increasing stretch of postural mm.  Audible crepitus with Lt TKE using bodycraft machine for resistance.  Manual and verbal cues needed throughout for correct form with exercises and reduce substitution patterns.  No complaints of pain, some discomfort and general fatigue at end of session.    Pt will continue to benefit from skilled PT to improve ROM, strength and balance to improve functional ability.   OBJECTIVE IMPAIRMENTS Abnormal gait, decreased  activity tolerance, decreased balance, difficulty walking, decreased ROM, decreased strength, increased edema, impaired flexibility, and pain.   ACTIVITY LIMITATIONS carrying, lifting, bending, sitting, squatting, stairs, and locomotion level  PARTICIPATION LIMITATIONS: cleaning, laundry, shopping, community activity, and occupation  [place] and Time since onset of injury/illness/exacerbation are also affecting patient's functional outcome.   REHAB POTENTIAL: Good  CLINICAL DECISION MAKING: Stable/uncomplicated  EVALUATION COMPLEXITY: Moderate   GOALS: Goals reviewed with patient? Yes  SHORT TERM GOALS: Target date: 04/08/2022  PT to be I in HEP to improve B knee extension by 5 degrees  Baseline: Rt -12; Lt -5 Goal status: IN PROGRESS  2.  PT knee flexion to be improved to 90 degrees to improve ease of sit to stand.  Baseline: 75  Goal status: IN PROGRESS  3.  PT LE and core strength to have increased 1/2 grade to allow pt to be able to complete 11 sit to stand in 30 seconds  Baseline:  Goal status: IN PROGRESS    LONG TERM GOALS: Target date: 04/29/2022   PT to be I in and advanced HEP to decrease knee pain to no greater than a 5/10 throughout the day.  Baseline:  Goal status: IN PROGRESS  2.  PT core and LE strength to be increased 1 grade to be able to ascend and descend 4 steps with one hand rail assist in a reciprocal manner. Baseline:  Goal status: IN PROGRESS  3.  Pt ROM for B flexion to be to 115 to allow pt to squat to pick items off the floor with ease.  Baseline:  Goal status: IN PROGRESS  4.  Pt to be able to single leg stance for 30 seconds B for decreased risk of falling  Baseline:  Goal status: IN PROGRESS  5.  Pt to be completing a walking program 3x a week for improved health. Baseline:  Goal status: IN PROGRESS PLAN: PT FREQUENCY: 2x/week MD requested 3, however, with work pt feels she can only make 2.   PT DURATION: 6  weeks  PLANNED INTERVENTIONS: Therapeutic exercises, Therapeutic activity, Balance training, Gait training, Patient/Family education, and Manual therapy  PLAN FOR NEXT SESSION: continue to progress standing exercises for ROM, balance and strength.   Teena Irani, PTA/CLT [place] Ph: [phone] 03/18/2022, 10:37 AM

## 2022-03-22 ENCOUNTER — Ambulatory Visit (HOSPITAL_COMMUNITY): Payer: BC Managed Care – PPO | Admitting: Physical Therapy

## 2022-03-22 DIAGNOSIS — R262 Difficulty in walking, not elsewhere classified: Secondary | ICD-10-CM

## 2022-03-22 DIAGNOSIS — M6281 Muscle weakness (generalized): Secondary | ICD-10-CM

## 2022-03-22 DIAGNOSIS — G8929 Other chronic pain: Secondary | ICD-10-CM

## 2022-03-22 DIAGNOSIS — M25561 Pain in right knee: Secondary | ICD-10-CM | POA: Diagnosis not present

## 2022-03-22 NOTE — Therapy (Signed)
OUTPATIENT PHYSICAL THERAPY LOWER EXTREMITY TREATMENT   Patient Name: Jodi Duran MRN: [id number] DOB:08/20/1956, 66 y.o., female Today's Date: 03/22/2022   PT End of Session - 03/22/22 0907     Visit Number 6    Number of Visits 12    Date for PT Re-Evaluation 03/30/22    Authorization Type BCBS    Authorization - Visit Number 6    Progress Note Due on Visit 10    PT Start Time 0907    PT Stop Time 0948   PT Time Calculation (min) 41 min    Activity Tolerance Patient tolerated treatment well    Behavior During Therapy [facility] for tasks assessed/performed               Past Medical History:  Diagnosis Date   Arthritis    Hypertension    Past Surgical History:  Procedure Laterality Date   COLONOSCOPY  06/21/2007   QPY:[id number] internal hemorrhoids/Large amount of liquid stool seen in the transverse colon and descending colon which contained particulate matter   COLONOSCOPY N/A 07/11/2014   Procedure: COLONOSCOPY;  Surgeon: Danie Binder, MD;  Location: [facility];  Service: Endoscopy;  Laterality: N/A;  10:30  - moved to 10:45 - Ginger to notify pt   Patient Active Problem List   Diagnosis Date Noted   Pain in left knee 11/18/2019   Obesity (BMI 30-39.9) 10/23/2019   COVID-19 virus detected 10/23/2019   Essential hypertension 05/21/2019   Baker's cyst of knee, left 05/21/2019   Swelling of limb 05/21/2019   Encounter for screening colonoscopy 06/18/2014   Corneal ulcer 02/11/2013   SHOULDER PAIN 11/13/2007   IMPINGEMENT SYNDROME 11/13/2007    PCP: Iona Beard  REFERRING PROVIDER: Arther Abbott   REFERRING DIAG: B knee pain  THERAPY DIAG:  Chronic pain of right knee  Chronic pain of left knee  Difficulty in walking, not elsewhere classified  Muscle weakness (generalized)  Rationale for Evaluation and Treatment Rehabilitation  ONSET DATE: chronic with acute exacerbation for 3 months.    SUBJECTIVE:   SUBJECTIVE STATEMENT: Pt states that  she has been on vacation for a week now she has returned to work and she is really achy;  PAIN:  Are you having pain? No NPRS scale:10/10 Pain location: B knee  Pain description: ache and throb Aggravating factors: sitting Relieving factors: meds   PERTINENT HISTORY: N/A  PAIN:  Are you having pain? Yes: NPRS scale: 4/10; worst 9/10; least 0/10 Pain location: B knee  Pain description: ache and throb Aggravating factors: sitting Relieving factors: meds   PRECAUTIONS: None  WEIGHT BEARING RESTRICTIONS No  FALLS:  Has patient fallen in last 6 months? Yes. 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  Works at [employer]: Independent  PATIENT GOALS Less pain, stronger., to be more stable and active.   OBJECTIVE:   DIAGNOSTIC FINDINGS: 1. Moderate to severe tricompartmental degenerative changes of the right knee with associated joint effusion. 2.  No acute displaced fracture or dislocation.    PATIENT SURVEYS:  FOTO 54  WFL  Observation:  noted edema B; pt states she wears compression socks unsure of the Amount.  Advised to get 20-30 mm hg.  LOWER EXTREMITY ROM:  Active ROM Right Eval 6/7  RT 6/28 RT 03/22/22 Left Eval 6/7 LT 6/28 Lt  03/11/22 LT 03/22/22 Rt  03/11/22  /Hip flexion          Hip extension          Hip abduction          Hip adduction          Hip internal rotation          Hip external rotation          Knee flexion 75 82 90 75 85 88 90 85  Knee extension -12 -9 -5 -5 -5( with significant encouragement) 8 degrees lacking -7 8 degrees lacking  Ankle dorsiflexion          Ankle plantarflexion          Ankle inversion          Ankle eversion           (Blank rows = not tested)  LOWER EXTREMITY MMT:  MMT Right eval Left eval  Hip  flexion 3+/5  4/5   Hip extension 3/5 3-/5   Hip abduction 4/5  3+/5   Hip adduction    Hip internal rotation    Hip external rotation    Knee flexion 4-/5 4-/5  Knee extension 3/5 3+/5   Ankle dorsiflexion 4/5 4+/5  Ankle plantarflexion    Ankle inversion    Ankle eversion     (Blank rows = not tested)  FUNCTIONAL TESTS:  30 seconds chair stand test 6 in 30 seconds  2 minute walk test: 274" Single leg stance:  Rt:  22"  LT: 22"    GAIT:  Comments: Decreased knee  and ankle motion with walking     TODAY'S TREATMENT: 03/22/22 Standing: Heel raises x 15 Mini squats x 15 Slant board stretch 3x 30" Sitting: LAQ with 3# x 15 each Elevated surface:  sit to stand x 10  Supine: Active hamstring stretch with  active knee flexion  Quad set B x 10 SAQ B x 15 Bridge x 10  03/18/22 Ambulation around clinic 200 ft no AD working on heel-toe, knee flexion, UE swing Standing: Heel raise on incline 20X  Toe raises on incline 20X  Knee drive flexion stretch bilaterally 5X10" on 12" step  Hamstring stretch bilaterally on 12" step 5X20"  Slantboard stretch 3X20"  TKE with bodycraft pulleys 3plates 15X5" each Seated: heelslides for knee flexion 10X10" with AAROM end range   LAQ 10X5" Supine: Bridge 2 sets 10 reps  SLR 10X each  03/11/22: Standing: gait training for heel to toe mechanics 217f   Heel raise incline slope   Toe raise decline slope   Knee drive 142PNstep height 5x 10"   Hamstring stretch 5x 20"   TKE 10 x5 " BLE  Sitting:   Heel slide for knee flexion 5x 10" AAROM end range  Supine: Bridge 10x   PATIENT EDUCATION:  Education details: Reviewed goals, educated importance of HEP compliance, pt able to recall HEP SLR and LAQ, required cueing to imporve quadricep activation and reduce compensation with gluteal mm Eval: HEP Person educated: Patient Education method: Explanation, Verbal cues, and Handouts Education comprehension: verbalized understanding and  returned demonstration   HOME EXERCISE PROGRAM: HEP  6/28:   Sitting ankle pumps, hip isometric adduction Supine:  SAQ, bridge   Access Code: [email or web address] URL: [email or web address] Date: 02/24/2022 Prepared by: CIhor Austin Exercises - Supine Quad Set  - 1 x daily - 7 x weekly -  3 sets - 10 reps - Supine Heel Slides  - 1 x daily - 7 x weekly - 3 sets - 10 reps - Straight Leg Raise  - 1 x daily - 7 x weekly - 3 sets - 10 reps - Seated Knee Flexion Extension AROM   - 1 x daily - 7 x weekly - 3 sets - 10 reps - Seated Long Arc Quad  - 1 x daily - 7 x weekly - 3 sets - 10 reps - 5" hold  ASSESSMENT:  CLINICAL IMPRESSION: Pt with higher pain level as she has returned to work.   Therapist added functional squat and heel raises to HEP to improve strength and functional ability.  Therapist ensured that treatment stayed within pt tolerance.  Pt with less pain at end of treatment.   Pt will continue to benefit from skilled PT to improve ROM, strength and balance to improve functional ability.   OBJECTIVE IMPAIRMENTS Abnormal gait, decreased activity tolerance, decreased balance, difficulty walking, decreased ROM, decreased strength, increased edema, impaired flexibility, and pain.   ACTIVITY LIMITATIONS carrying, lifting, bending, sitting, squatting, stairs, and locomotion level  PARTICIPATION LIMITATIONS: cleaning, laundry, shopping, community activity, and occupation  [place] and Time since onset of injury/illness/exacerbation are also affecting patient's functional outcome.   REHAB POTENTIAL: Good  CLINICAL DECISION MAKING: Stable/uncomplicated  EVALUATION COMPLEXITY: Moderate   GOALS: Goals reviewed with patient? Yes  SHORT TERM GOALS: Target date: 04/12/2022  PT to be I in HEP to improve B knee extension by 5 degrees  Baseline: Rt -12; Lt -5 Goal status: IN PROGRESS  2.  PT knee flexion to be improved to 90 degrees to improve ease of sit to  stand.  Baseline: 75 Goal status: IN PROGRESS  3.  PT LE and core strength to have increased 1/2 grade to allow pt to be able to complete 11 sit to stand in 30 seconds  Baseline:  Goal status: IN PROGRESS    LONG TERM GOALS: Target date: 05/03/2022   PT to be I in and advanced HEP to decrease knee pain to no greater than a 5/10 throughout the day.  Baseline:  Goal status: IN PROGRESS  2.  PT core and LE strength to be increased 1 grade to be able to ascend and descend 4 steps with one hand rail assist in a reciprocal manner. Baseline:  Goal status: IN PROGRESS  3.  Pt ROM for B flexion to be to 115 to allow pt to squat to pick items off the floor with ease.  Baseline:  Goal status: IN PROGRESS  4.  Pt to be able to single leg stance for 30 seconds B for decreased risk of falling  Baseline:  Goal status: IN PROGRESS  5.  Pt to be completing a walking program 3x a week for improved health. Baseline:  Goal status: IN PROGRESS PLAN: PT FREQUENCY: 2x/week MD requested 3, however, with work pt feels she can only make 2.   PT DURATION: 6 weeks  PLANNED INTERVENTIONS: Therapeutic exercises, Therapeutic activity, Balance training, Gait training, Patient/Family education, and Manual therapy  PLAN FOR NEXT SESSION: continue to progress standing exercises for ROM, balance and strength.   Rayetta Humphrey, PT CLT [phone]  949 AM

## 2022-03-24 ENCOUNTER — Ambulatory Visit (HOSPITAL_COMMUNITY): Payer: BC Managed Care – PPO | Admitting: Physical Therapy

## 2022-03-24 DIAGNOSIS — G8929 Other chronic pain: Secondary | ICD-10-CM

## 2022-03-24 DIAGNOSIS — M6281 Muscle weakness (generalized): Secondary | ICD-10-CM

## 2022-03-24 DIAGNOSIS — R262 Difficulty in walking, not elsewhere classified: Secondary | ICD-10-CM

## 2022-03-24 DIAGNOSIS — M25561 Pain in right knee: Secondary | ICD-10-CM | POA: Diagnosis not present

## 2022-03-24 NOTE — Therapy (Addendum)
OUTPATIENT PHYSICAL THERAPY LOWER EXTREMITY TREATMENT   Patient Name: Jodi Duran MRN: [id number] DOB:March 03, 1956, 66 y.o., female Today's Date: 03/24/2022   PT End of Session - 03/24/22  945    Visit Number 7   Number of Visits 12    Date for PT Re-Evaluation 03/30/22    Authorization Type BCBS    Authorization - Visit Number 7   Progress Note Due on Visit 10    PT Start Time 0907   PT Stop Time 0945   PT Time Calculation (min) 38 min    Activity Tolerance Patient tolerated treatment well    Behavior During Therapy [facility] for tasks assessed/performed               Past Medical History:  Diagnosis Date   Arthritis    Hypertension    Past Surgical History:  Procedure Laterality Date   COLONOSCOPY  06/21/2007   [facility]:WEXHB internal hemorrhoids/Large amount of liquid stool seen in the transverse colon and descending colon which contained particulate matter   COLONOSCOPY N/A 07/11/2014   Procedure: COLONOSCOPY;  Surgeon: Danie Binder, MD;  [facility];  Service: Endoscopy;  Laterality: N/A;  10:30  - moved to 10:45 - Ginger to notify pt   Patient Active Problem List   Diagnosis Date Noted   Pain in left knee 11/18/2019   Obesity (BMI 30-39.9) 10/23/2019   COVID-19 virus detected 10/23/2019   Essential hypertension 05/21/2019   Baker's cyst of knee, left 05/21/2019   Swelling of limb 05/21/2019   Encounter for screening colonoscopy 06/18/2014   Corneal ulcer 02/11/2013   SHOULDER PAIN 11/13/2007   IMPINGEMENT SYNDROME 11/13/2007    PCP: Iona Beard  REFERRING PROVIDER: Arther Abbott   REFERRING DIAG: B knee pain  THERAPY DIAG:  Chronic pain of right knee  Chronic pain of left knee  Difficulty in walking, not elsewhere classified  Muscle weakness (generalized)  Rationale for Evaluation and Treatment Rehabilitation  ONSET DATE: chronic with acute exacerbation for 3 months.    SUBJECTIVE:   SUBJECTIVE STATEMENT: Pt states that she  woke up and felt very stiff.  Pain is up.   Are you having pain? No NPRS scale:8/10 Pain location: B knee  Pain description: ache and throb Aggravating factors: sitting Relieving factors: meds   PERTINENT HISTORY: N/A   PRECAUTIONS: None  WEIGHT BEARING RESTRICTIONS No  FALLS:  Has patient fallen in last 6 months? Yes. 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  Works at [employer]: Independent  PATIENT GOALS Less pain, stronger., to be more stable and active.   OBJECTIVE:   DIAGNOSTIC FINDINGS: 1. Moderate to severe tricompartmental degenerative changes of the right knee with associated joint effusion. 2.  No acute displaced fracture or dislocation.    PATIENT SURVEYS:  FOTO 91  WFL  Observation:  noted edema B; pt states she wears compression socks unsure of the Amount.  Advised to get 20-30 mm hg.  LOWER EXTREMITY ROM:  Active ROM Right Eval 6/7 RT 6/28 RT 03/22/22 Left Eval 6/7 LT 6/28 Lt  03/11/22 LT 03/22/22 Rt  03/11/22  /Hip flexion          Hip extension          Hip abduction  Hip adduction          Hip internal rotation          Hip external rotation          Knee flexion 75 82 90 75 85 88 90 85  Knee extension -12 -9 -5 -5 -5( with significant encouragement) 8 degrees lacking -7 8 degrees lacking  Ankle dorsiflexion          Ankle plantarflexion          Ankle inversion          Ankle eversion           (Blank rows = not tested)  LOWER EXTREMITY MMT:  MMT Right eval Left eval  Hip flexion 3+/5  4/5   Hip extension 3/5 3-/5   Hip abduction 4/5  3+/5   Hip adduction    Hip internal rotation    Hip external rotation    Knee flexion 4-/5 4-/5  Knee extension 3/5 3+/5   Ankle dorsiflexion 4/5 4+/5  Ankle  plantarflexion    Ankle inversion    Ankle eversion     (Blank rows = not tested)  FUNCTIONAL TESTS:  30 seconds chair stand test 6 in 30 seconds  2 minute walk test: 274" Single leg stance:  Rt:  22"  LT: 22"    GAIT:  Comments: Decreased knee  and ankle motion with walking     TODAY'S TREATMENT: 03/24/22: Standing: Knee drivers B x 10  Standing knee flexion x 10 Wall squat x 5  Heel raises x 15 Mini squats x 15 Slant board stretch 3x 30" Working on heel toe gait pattern  Sitting: LAQ with 5# x 10 each Elevated surface:  sit to stand x 10  Supine: Heel slides x 10 B  03/22/22 Standing: Heel raises x 15 Mini squats x 15 Slant board stretch 3x 30" Sitting: LAQ with 3# x 15 each Elevated surface:  sit to stand x 10  Supine: Active hamstring stretch with  active knee flexion  Quad set B x 10 SAQ B x 15 Bridge x 10  03/18/22 Ambulation around clinic 200 ft no AD working on heel-toe, knee flexion, UE swing Standing: Heel raise on incline 20X  Toe raises on incline 20X  Knee drive flexion stretch bilaterally 5X10" on 12" step  Hamstring stretch bilaterally on 12" step 5X20"  Slantboard stretch 3X20"  TKE with bodycraft pulleys 3plates 15X5" each Seated: heelslides for knee flexion 10X10" with AAROM end range   LAQ 10X5" Supine: Bridge 2 sets 10 reps  SLR 10X each  03/11/22: Standing: gait training for heel to toe mechanics 266f   Heel raise incline slope   Toe raise decline slope   Knee drive 138UEstep height 5x 10"   Hamstring stretch 5x 20"   TKE 10 x5 " BLE  Sitting:   Heel slide for knee flexion 5x 10" AAROM end range  Supine: Bridge 10x   PATIENT EDUCATION:  7/13:  Advised pt that she would do better with thigh high compression.  Measured and gave pt sheet for Elastic therapy.  Education details: Reviewed goals, educated importance of HEP compliance, pt able to recall HEP SLR and LAQ, required cueing to imporve quadricep activation and reduce  compensation with gluteal mm Eval: HEP Person educated: Patient Education method: Explanation, Verbal cues, and Handouts Education comprehension: verbalized understanding and returned demonstration   HOME EXERCISE PROGRAM: HEP  7/13: Heel raise, sit to stand, standing knee  flexion, wall squat  6/28:   Sitting ankle pumps, hip isometric adduction Supine:  SAQ, bridge   Access Code:  URL: [email or web address] Date: 02/24/2022 Prepared by: Ihor Austin  Exercises - Supine Quad Set  - 1 x daily - 7 x weekly - 3 sets - 10 reps - Supine Heel Slides  - 1 x daily - 7 x weekly - 3 sets - 10 reps - Straight Leg Raise  - 1 x daily - 7 x weekly - 3 sets - 10 reps - Seated Knee Flexion Extension AROM   - 1 x daily - 7 x weekly - 3 sets - 10 reps - Seated Long Arc Quad  - 1 x daily - 7 x weekly - 3 sets - 10 reps - 5" hold  ASSESSMENT:  CLINICAL IMPRESSION: Therapist notes that patient is walking into the clinic with stiff knees.  Therapist stressed the importance of working on heel toe gt at home so she does not get issues with her hip and back.  Therapist went over walking phases with pt and added to HEP as well as wall squat and standing knee flexion.Pt will continue to benefit from skilled PT to improve ROM, strength and balance to improve functional ability.   OBJECTIVE IMPAIRMENTS Abnormal gait, decreased activity tolerance, decreased balance, difficulty walking, decreased ROM, decreased strength, increased edema, impaired flexibility, and pain.   ACTIVITY LIMITATIONS carrying, lifting, bending, sitting, squatting, stairs, and locomotion level  PARTICIPATION LIMITATIONS: cleaning, laundry, shopping, community activity, and occupation  [place] and Time since onset of injury/illness/exacerbation are also affecting patient's functional outcome.   REHAB POTENTIAL: Good  CLINICAL DECISION MAKING: Stable/uncomplicated  EVALUATION COMPLEXITY:  Moderate   GOALS: Goals reviewed with patient? Yes  SHORT TERM GOALS: Target date: 04/14/2022  PT to be I in HEP to improve B knee extension by 5 degrees  Baseline: Rt -12; Lt -5 Goal status: MET  2.  PT knee flexion to be improved to 90 degrees to improve ease of sit to stand.  Baseline: 75 Goal status: MET  3.  PT LE and core strength to have increased 1/2 grade to allow pt to be able to complete 11 sit to stand in 30 seconds  Baseline:  Goal status: IN PROGRESS    LONG TERM GOALS: Target date: 05/05/2022   PT to be I in and advanced HEP to decrease knee pain to no greater than a 5/10 throughout the day.  Baseline:  Goal status: IN PROGRESS  2.  PT core and LE strength to be increased 1 grade to be able to ascend and descend 4 steps with one hand rail assist in a reciprocal manner. Baseline:  Goal status: IN PROGRESS  3.  Pt ROM for B flexion to be to 115 to allow pt to squat to pick items off the floor with ease.  Baseline:  Goal status: IN PROGRESS  4.  Pt to be able to single leg stance for 30 seconds B for decreased risk of falling  Baseline:  Goal status: IN PROGRESS  5.  Pt to be completing a walking program 3x a week for improved health. Baseline:  Goal status: IN PROGRESS PLAN: PT FREQUENCY: 2x/week MD requested 3, however, with work pt feels she can only make 2.   PT DURATION: 6 weeks  PLANNED INTERVENTIONS: Therapeutic exercises, Therapeutic activity, Balance training, Gait training, Patient/Family education, and Manual therapy  PLAN FOR NEXT SESSION: continue to progress standing exercises for ROM, balance and strength.  Rayetta Humphrey, [place] CLT [phone]  1029 AM

## 2022-03-29 ENCOUNTER — Encounter (HOSPITAL_COMMUNITY): Payer: Self-pay | Admitting: Physical Therapy

## 2022-03-29 ENCOUNTER — Ambulatory Visit (HOSPITAL_COMMUNITY): Payer: BC Managed Care – PPO | Admitting: Physical Therapy

## 2022-03-29 DIAGNOSIS — M6281 Muscle weakness (generalized): Secondary | ICD-10-CM

## 2022-03-29 DIAGNOSIS — R262 Difficulty in walking, not elsewhere classified: Secondary | ICD-10-CM

## 2022-03-29 DIAGNOSIS — G8929 Other chronic pain: Secondary | ICD-10-CM

## 2022-03-29 DIAGNOSIS — M25561 Pain in right knee: Secondary | ICD-10-CM | POA: Diagnosis not present

## 2022-03-29 NOTE — Therapy (Signed)
OUTPATIENT PHYSICAL THERAPY LOWER EXTREMITY TREATMENT   Patient Name: Jodi Duran MRN: [id number] DOB:[birth year], 66 y.o., female Today's Date: 03/29/2022 Progress Note Reporting Period 6/07 to 7/18  See note below for Objective Data and Assessment of Progress/Goals.    Evaluation Rt knee -12-75 now -5 -95; LT knee -5-75 now -7-95:  Strength increased significantly  PT End of Session - 03/29/22    Visit Number 8   Number of Visits 16   Date for PT Re-Evaluation 05/10/22    Authorization Type BCBS    Authorization - Visit Number 8   Progress Note Due on Visit 16   PT Start Time 1435   PT Stop Time 1506   PT Time Calculation (min) 31 min    Activity Tolerance Patient tolerated treatment well    Behavior During Therapy WFL for tasks assessed/performed               Past Medical History:  Diagnosis Date   Arthritis    Hypertension    Past Surgical History:  Procedure Laterality Date   COLONOSCOPY  06/21/2007   [facility]:WYSHU internal hemorrhoids/Large amount of liquid stool seen in the transverse colon and descending colon which contained particulate matter   COLONOSCOPY N/A 07/11/2014   Procedure: COLONOSCOPY;  Surgeon: Danie Binder, MD;  [facility];  Service: Endoscopy;  Laterality: N/A;  10:30  - moved to 10:45 - Ginger to notify pt   Patient Active Problem List   Diagnosis Date Noted   Pain in left knee 11/18/2019   Obesity (BMI 30-39.9) 10/23/2019   COVID-19 virus detected 10/23/2019   Essential hypertension 05/21/2019   Baker's cyst of knee, left 05/21/2019   Swelling of limb 05/21/2019   Encounter for screening colonoscopy 06/18/2014   Corneal ulcer 02/11/2013   SHOULDER PAIN 11/13/2007   IMPINGEMENT SYNDROME 11/13/2007    PCP: Iona Beard  REFERRING PROVIDER: Arther Abbott   REFERRING DIAG: B knee pain  THERAPY DIAG:  Chronic pain of right knee  Chronic pain of left knee  Difficulty in walking, not elsewhere  classified  Muscle weakness (generalized)  Rationale for Evaluation and Treatment Rehabilitation  ONSET DATE: chronic with acute exacerbation for 3 months.    SUBJECTIVE:   SUBJECTIVE STATEMENT: Pt states that her father passed away suddenly 29-Oct-2022.  Are you having pain? No NPRS scale:5/10 worst pain is 10 and above  Pain location: B knee  Pain description: ache and throb Aggravating factors: sitting Relieving factors: meds  FALLS:  Has patient fallen in last 6 months? Yes. 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  Works at [employer]: Independent  PATIENT GOALS Less pain, stronger., to be more stable and active.   OBJECTIVE:   DIAGNOSTIC FINDINGS: 1. Moderate to severe tricompartmental degenerative changes of the right knee with associated joint effusion. 2.  No acute displaced fracture or dislocation.    PATIENT SURVEYS:  Foto 56 was 90    Observation:  noted edema B; pt states she wears compression socks unsure of the Amount.  Advised to get 20-30 mm hg.  LOWER EXTREMITY ROM:  Active ROM Right Eval 6/7 RT 6/28 RT 03/22/22 RT 7/18 Left Eval 6/7 LT 6/28 Lt  03/11/22 LT 03/22/22 Lt 03/29/22    /Hip flexion            Hip extension  Hip abduction            Hip adduction            Hip internal rotation            Hip external rotation            Knee flexion 75 82 90 92 75 85 88 90 95   Knee extension -12 -9 -5 -5 -5 -5( with significant encouragement) 8 degrees lacking -7 -7   Ankle dorsiflexion            Ankle plantarflexion            Ankle inversion            Ankle eversion             (Blank rows = not tested)  LOWER EXTREMITY MMT:  MMT Right eval Right  7/18 Left eval Left 7/18   Hip flexion 3+/5  4+/5 4/5  4+/5   Hip extension 3/5 3+/5 3-/5  4-/5   Hip abduction 4/5  4+/5  3+/5  4+/5   Hip adduction       Hip internal rotation       Hip  external rotation       Knee flexion 4-/5 4+/5 4-/5 4+/5   Knee extension 3/5 5/5 3+/5  5/5   Ankle dorsiflexion 4/5 5/5 4+/5 5/5   Ankle plantarflexion       Ankle inversion       Ankle eversion        (Blank rows = not tested)  FUNCTIONAL TESTS:  30 seconds chair stand test:  7/18:  7 in 30 seconds;  evaluation  6 in 30 seconds  2 minute walk test: 274" Single leg stance:  Rt:  22"  LT: 22"    GAIT:  Comments: Decreased knee  and ankle motion with walking     TODAY'S TREATMENT: 7/18:  Reassessed.  Standing: steps 2 RT in reciprocal manner with contact guard assist.  Sit:  Sit to stand x 10 Supine:  knee flexion x 5 B\               Quad set x 10               Bridge x 10  03/24/22: Standing: Knee drivers B x 10  Standing knee flexion x 10 Wall squat x 5  Heel raises x 15 Mini squats x 15 Slant board stretch 3x 30" Working on heel toe gait pattern  Sitting: LAQ with 5# x 10 each Elevated surface:  sit to stand x 10  Supine: Heel slides x 10 B   Sitting:   Heel slide for knee flexion 5x 10" AAROM end range  Supine: Bridge 10x   PATIENT EDUCATION: 7/18: PT had left her sheet to obtain compression therapist gave pt new sheet and stressed the benefit of wearing compression.  7/13:  Advised pt that she would do better with thigh high compression.  Measured and gave pt sheet for Elastic therapy.  Education details: Reviewed goals, educated importance of HEP compliance, pt able to recall HEP SLR and LAQ, required cueing to imporve quadricep activation and reduce compensation with gluteal mm Eval: HEP Person educated: Patient Education method: Explanation, Verbal cues, and Handouts Education comprehension: verbalized understanding and returned demonstration   HOME EXERCISE PROGRAM: HEP  7/13: Heel raise, sit to stand, standing knee flexion, wall squat  6/28:   Sitting ankle pumps, hip isometric  adduction Supine:  SAQ, bridge   Access Code:  URL:  [email or web address] Date: 02/24/2022 Prepared by: Ihor Austin  Exercises - Supine Quad Set  - 1 x daily - 7 x weekly - 3 sets - 10 reps - Supine Heel Slides  - 1 x daily - 7 x weekly - 3 sets - 10 reps - Straight Leg Raise  - 1 x daily - 7 x weekly - 3 sets - 10 reps - Seated Knee Flexion Extension AROM   - 1 x daily - 7 x weekly - 3 sets - 10 reps - Seated Long Arc Quad  - 1 x daily - 7 x weekly - 3 sets - 10 reps - 5" hold  ASSESSMENT:  CLINICAL IMPRESSION:   Therapist gave pt measurement and sheet to obtain thigh high compression garments for her LE B.   Pt reassessed.  She has improved significantly in her strength and ROM.  She still has significant limitation in her hip extension strength and knee flexion.  Pt Gt remains impaired due to habit of walking stiff legged.  Pt has met 2/3 STG and 1/4 LTG.  She is completing her HEP but will continue to benefit from skilled PT to guide her on HEP to improve her remaining deficits.  OBJECTIVE IMPAIRMENTS Abnormal gait, decreased activity tolerance, decreased balance, difficulty walking, decreased ROM, decreased strength, increased edema, impaired flexibility, and pain.   ACTIVITY LIMITATIONS carrying, lifting, bending, sitting, squatting, stairs, and locomotion level  PARTICIPATION LIMITATIONS: cleaning, laundry, shopping, community activity, and occupation  [place] and Time since onset of injury/illness/exacerbation are also affecting patient's functional outcome.   REHAB POTENTIAL: Good  CLINICAL DECISION MAKING: Stable/uncomplicated  EVALUATION COMPLEXITY: Moderate   GOALS: Goals reviewed with patient? Yes  SHORT TERM GOALS: Target date: 04/19/2022  PT to be I in HEP to improve B knee extension by 5 degrees  Baseline: Rt -12; Lt -5 Goal status: MET  2.  PT knee flexion to be improved to 90 degrees to improve ease of sit to stand.  Baseline: 75 Goal status: MET  3.  PT LE and core strength  to have increased 1/2 grade to allow pt to be able to complete 11 sit to stand in 30 seconds  Baseline:  Goal status: IN PROGRESS (10 sit to stand)     LONG TERM GOALS: Target date: 05/10/2022   PT to be I in and advanced HEP to decrease knee pain to no greater than a 5/10 throughout the day.  Baseline:  Goal status: IN PROGRESS  2.  PT core and LE strength to be increased 1 grade to be able to ascend and descend 4 steps with one hand rail assist in a reciprocal manner. Baseline:  Goal status: met  3.  Pt ROM for B flexion to be to 115 to allow pt to squat to pick items off the floor with ease.  Baseline:  Goal status: IN PROGRESS  4.  Pt to be able to single leg stance for 30 seconds B for decreased risk of falling  Baseline:  Goal status: IN PROGRESS  5.  Pt to be completing a walking program 3x a week for improved health. Baseline:  Goal status: IN PROGRESS PLAN: PT FREQUENCY: 2x/week MD requested 3, however, with work pt feels she can only make 2.  7/18:  reassessment recommend continued therapy 1 x a week for 6  more weeks to advanced HEP to continue improving ROM as well as balance and  strength.   PT DURATION: 6 weeks total of 6 weeks   PLANNED INTERVENTIONS: Therapeutic exercises, Therapeutic activity, Balance training, Gait training, Patient/Family education, and Manual therapy  PLAN FOR NEXT SESSION: continue to progress standing exercises for ROM, quad stretch for improved ROM, balance and strength.   Rayetta Humphrey, [place] CLT [phone]  1029 AM

## 2022-03-31 ENCOUNTER — Encounter (HOSPITAL_COMMUNITY): Payer: BC Managed Care – PPO

## 2022-04-07 ENCOUNTER — Ambulatory Visit: Payer: BC Managed Care – PPO | Admitting: Orthopedic Surgery

## 2022-04-14 ENCOUNTER — Encounter (HOSPITAL_COMMUNITY): Payer: BC Managed Care – PPO

## 2022-04-19 ENCOUNTER — Ambulatory Visit (HOSPITAL_COMMUNITY): Payer: BC Managed Care – PPO

## 2022-04-28 ENCOUNTER — Encounter (HOSPITAL_COMMUNITY): Payer: BC Managed Care – PPO

## 2022-05-04 ENCOUNTER — Other Ambulatory Visit (HOSPITAL_COMMUNITY): Payer: Self-pay | Admitting: Family Medicine

## 2022-05-04 DIAGNOSIS — Z1231 Encounter for screening mammogram for malignant neoplasm of breast: Secondary | ICD-10-CM

## 2022-05-05 ENCOUNTER — Encounter (HOSPITAL_COMMUNITY): Payer: BC Managed Care – PPO | Admitting: Physical Therapy

## 2022-05-12 ENCOUNTER — Telehealth (HOSPITAL_COMMUNITY): Payer: Self-pay | Admitting: Physical Therapy

## 2022-05-12 ENCOUNTER — Encounter (HOSPITAL_COMMUNITY): Payer: BC Managed Care – PPO | Admitting: Physical Therapy

## 2022-05-12 NOTE — Telephone Encounter (Signed)
PT no showed.  She has not been in therapy since 7/18.  Called pt house, she was not in but her mother stated that she would have pt call the clinic to see if she wanted to get back into therapy or be discharged.  Rayetta Humphrey, [place] CLT [phone]

## 2022-06-03 ENCOUNTER — Ambulatory Visit (HOSPITAL_COMMUNITY)
Admission: RE | Admit: 2022-06-03 | Discharge: 2022-06-03 | Disposition: A | Discharge status: Home / Self Care | Payer: BC Managed Care – PPO | Source: Ambulatory Visit | Attending: Family Medicine | Admitting: Family Medicine

## 2022-06-03 DIAGNOSIS — Z1231 Encounter for screening mammogram for malignant neoplasm of breast: Secondary | ICD-10-CM | POA: Diagnosis present

## 2022-07-30 ENCOUNTER — Encounter (HOSPITAL_COMMUNITY): Payer: Self-pay | Admitting: Physical Therapy

## 2022-07-30 NOTE — Therapy (Signed)
[facility] [place], [place], [address] Phone: [phone]   Fax:  [phone]  Patient Details  Name: Jodi Duran MRN: [id number] Date of Birth: 06-02-1956 Referring Provider:  No ref. provider found  Encounter Date: 07/30/2022  PHYSICAL THERAPY DISCHARGE SUMMARY  Visits from Start of Care: 8  Current functional level related to goals / functional outcomes: Unknown as pt did not return for future treatments.   Remaining deficits: Unknown as pt did not return for future treatments.     Education / Equipment: HEP   Patient agrees to discharge. Patient goals were partially met. Patient is being discharged due to not returning since the last visit.  07/30/2022, 9:49 AM   Rayetta Humphrey, PT CLT [facility] [address] [place], [place], [address] Phone: [phone]   Fax:  [phone]

## 2022-09-30 DIAGNOSIS — M170 Bilateral primary osteoarthritis of knee: Secondary | ICD-10-CM | POA: Diagnosis not present

## 2022-10-14 DIAGNOSIS — M170 Bilateral primary osteoarthritis of knee: Secondary | ICD-10-CM | POA: Diagnosis not present

## 2022-10-21 DIAGNOSIS — M170 Bilateral primary osteoarthritis of knee: Secondary | ICD-10-CM | POA: Diagnosis not present

## 2022-11-04 DIAGNOSIS — M170 Bilateral primary osteoarthritis of knee: Secondary | ICD-10-CM | POA: Diagnosis not present

## 2023-03-19 ENCOUNTER — Other Ambulatory Visit: Payer: Self-pay | Admitting: Orthopedic Surgery

## 2023-03-19 DIAGNOSIS — M170 Bilateral primary osteoarthritis of knee: Secondary | ICD-10-CM

## 2023-04-11 DIAGNOSIS — M170 Bilateral primary osteoarthritis of knee: Secondary | ICD-10-CM | POA: Diagnosis not present

## 2023-04-11 DIAGNOSIS — M25522 Pain in left elbow: Secondary | ICD-10-CM | POA: Diagnosis not present

## 2023-04-25 DIAGNOSIS — M170 Bilateral primary osteoarthritis of knee: Secondary | ICD-10-CM | POA: Diagnosis not present

## 2023-05-09 DIAGNOSIS — E785 Hyperlipidemia, unspecified: Secondary | ICD-10-CM | POA: Diagnosis not present

## 2023-05-09 DIAGNOSIS — I10 Essential (primary) hypertension: Secondary | ICD-10-CM | POA: Diagnosis not present

## 2023-05-09 DIAGNOSIS — M170 Bilateral primary osteoarthritis of knee: Secondary | ICD-10-CM | POA: Diagnosis not present

## 2023-05-10 DIAGNOSIS — M170 Bilateral primary osteoarthritis of knee: Secondary | ICD-10-CM | POA: Diagnosis not present

## 2023-05-10 DIAGNOSIS — E7800 Pure hypercholesterolemia, unspecified: Secondary | ICD-10-CM | POA: Diagnosis not present

## 2023-05-10 DIAGNOSIS — I10 Essential (primary) hypertension: Secondary | ICD-10-CM | POA: Diagnosis not present

## 2023-05-16 DIAGNOSIS — M170 Bilateral primary osteoarthritis of knee: Secondary | ICD-10-CM | POA: Diagnosis not present

## 2023-05-16 DIAGNOSIS — R946 Abnormal results of thyroid function studies: Secondary | ICD-10-CM | POA: Diagnosis not present

## 2023-05-16 DIAGNOSIS — I10 Essential (primary) hypertension: Secondary | ICD-10-CM | POA: Diagnosis not present

## 2023-05-16 DIAGNOSIS — Z1329 Encounter for screening for other suspected endocrine disorder: Secondary | ICD-10-CM | POA: Diagnosis not present

## 2023-05-16 DIAGNOSIS — Z0001 Encounter for general adult medical examination with abnormal findings: Secondary | ICD-10-CM | POA: Diagnosis not present

## 2023-05-16 DIAGNOSIS — E785 Hyperlipidemia, unspecified: Secondary | ICD-10-CM | POA: Diagnosis not present

## 2023-05-16 DIAGNOSIS — M069 Rheumatoid arthritis, unspecified: Secondary | ICD-10-CM | POA: Diagnosis not present

## 2023-06-07 ENCOUNTER — Ambulatory Visit: Payer: Medicare Other | Attending: Internal Medicine | Admitting: Internal Medicine

## 2023-06-07 ENCOUNTER — Encounter: Payer: Self-pay | Admitting: Internal Medicine

## 2023-06-07 ENCOUNTER — Other Ambulatory Visit (HOSPITAL_COMMUNITY): Payer: Self-pay | Admitting: Family Medicine

## 2023-06-07 VITALS — BP 118/72 | HR 83 | Ht 67.0 in | Wt 257.2 lb

## 2023-06-07 DIAGNOSIS — Z01810 Encounter for preprocedural cardiovascular examination: Secondary | ICD-10-CM

## 2023-06-07 DIAGNOSIS — I872 Venous insufficiency (chronic) (peripheral): Secondary | ICD-10-CM

## 2023-06-07 DIAGNOSIS — Z8249 Family history of ischemic heart disease and other diseases of the circulatory system: Secondary | ICD-10-CM | POA: Diagnosis not present

## 2023-06-07 DIAGNOSIS — Z1231 Encounter for screening mammogram for malignant neoplasm of breast: Secondary | ICD-10-CM

## 2023-06-07 NOTE — Patient Instructions (Signed)
Medication Instructions:  Your physician recommends that you continue on your current medications as directed. Please refer to the Current Medication list given to you today.  *If you need a refill on your cardiac medications before your next appointment, please call your pharmacy*   Lab Work: LP(a)  If you have labs (blood work) drawn today and your tests are completely normal, you will receive your results only by: MyChart Message (if you have MyChart) OR A paper copy in the mail If you have any lab test that is abnormal or we need to change your treatment, we will call you to review the results.   Testing/Procedures: None   Follow-Up: At [facility], you and your health needs are our priority.  As part of our continuing mission to provide you with exceptional heart care, we have created designated Provider Care Teams.  These Care Teams include your primary Cardiologist (physician) and Advanced Practice Providers (APPs -  Physician Assistants and Nurse Practitioners) who all work together to provide you with the care you need, when you need it.  We recommend signing up for the patient portal called "MyChart".  Sign up information is provided on this After Visit Summary.  MyChart is used to connect with patients for Virtual Visits (Telemedicine).  Patients are able to view lab/test results, encounter notes, upcoming appointments, etc.  Non-urgent messages can be sent to your provider as well.   To learn more about what you can do with MyChart, go to [email or web address].    Your next appointment:    Follow up is pending testing results  Provider:   You may see Luane School, MD or one of the following Advanced Practice Providers on your designated Care Team:   [place], PA-C  Jacolyn Reedy, [place]     Other Instructions

## 2023-06-07 NOTE — Progress Notes (Signed)
LDL 113 TG 106     Cardiology Office Note  Date: 06/07/2023   ID: TREA CHRISP, DOB 10/16/1955, MRN [id number]  PCP:  Mirna Mires, MD  Cardiologist:  Marjo Bicker, MD Electrophysiologist:  None   History of Present Illness: Jodi Duran is a 67 y.o. female known to have HTN was referred to cardiology clinic for evaluation of CAD screening and preop cardiac risk stratification prior to knee surgery.  Overall doing great, no symptoms of angina, DOE, orthopnea, PND.  Leg swelling present in the left leg.  No dizziness, presyncope and syncope.  No family history of CAD.  No prior history of MI/PCI/CABG or ischemia evaluation.  METs more than 4, according to [facility] activity status index.  Past Medical History:  Diagnosis Date   Arthritis    Hypertension     Past Surgical History:  Procedure Laterality Date   COLONOSCOPY  06/21/2007   WUJ:[id number] internal hemorrhoids/Large amount of liquid stool seen in the transverse colon and descending colon which contained particulate matter   COLONOSCOPY N/A 07/11/2014   Procedure: COLONOSCOPY;  Surgeon: West Bali, MD;  [facility];  Service: Endoscopy;  Laterality: N/A;  10:30  - moved to 10:45 - Ginger to notify pt    Current Outpatient Medications  Medication Sig Dispense Refill   Conj Estrogens-Bazedoxifene 0.45-20 MG TABS Take 1 tablet by mouth daily.     cyclobenzaprine (FLEXERIL) 5 MG tablet Take 5 mg by mouth 3 (three) times daily as needed for muscle spasms.     cyclobenzaprine (FLEXERIL) 5 MG tablet Take 1 tablet (5 mg total) by mouth 3 (three) times daily as needed for muscle spasms. Do not drink alcohol or drive while taking this medication.  May cause drowsiness. 15 tablet 0   ENBREL SURECLICK 50 MG/ML injection Inject into the skin once a week.     hydrochlorothiazide (HYDRODIURIL) 25 MG tablet Take 25 mg by mouth daily.     hydrochlorothiazide (HYDRODIURIL) 25 MG tablet Take 25 mg by mouth daily.      HYDROcodone-acetaminophen (NORCO/VICODIN) 5-325 MG tablet Take 1 tablet by mouth every 6 (six) hours as needed for severe pain. 15 tablet 0   naproxen (NAPROSYN) 500 MG tablet Take 500 mg by mouth 2 (two) times daily.     Naproxen-Esomeprazole 500-20 MG TBEC Take by mouth.     predniSONE (DELTASONE) 10 MG tablet TAKE 1 TABLET BY MOUTH THREE TIMES DAILY 42 tablet 0   traMADol (ULTRAM) 50 MG tablet Take 50 mg by mouth 2 (two) times daily.     HYDROcodone-acetaminophen (NORCO/VICODIN) 5-325 MG tablet Take 1 tablet by mouth every 6 (six) hours as needed for severe pain. (Patient not taking: Reported on 06/07/2023) 6 tablet 0   No current facility-administered medications for this visit.   Allergies:  Meloxicam and Methotrexate   Social History: The patient  reports that she has quit smoking. Her smoking use included cigarettes. She has a 3.8 pack-year smoking history. She has never used smokeless tobacco. She reports current alcohol use. She reports that she does not use drugs.   Family History: The patient's family history includes Congestive Heart Failure in her mother; Diabetes in her mother.   ROS:  Please see the history of present illness. Otherwise, complete review of systems is positive for none.  All other systems are reviewed and negative.   Physical Exam: VS:  BP 118/72   Pulse 83   Ht 5\' 7"  (1.702 m)  Wt 257 lb 3.2 oz (116.7 kg)   SpO2 96%   BMI 40.28 kg/m , BMI Body mass index is 40.28 kg/m.  Wt Readings from Last 3 Encounters:  06/07/23 257 lb 3.2 oz (116.7 kg)  10/14/21 253 lb (114.8 kg)  08/18/21 257 lb (116.6 kg)    General: Patient appears comfortable at rest. HEENT: Conjunctiva and lids normal, oropharynx clear with moist mucosa. Neck: Supple, no elevated JVP or carotid bruits, no thyromegaly. Lungs: Clear to auscultation, nonlabored breathing at rest. Cardiac: Regular rate and rhythm, no S3 or significant systolic murmur, no pericardial rub. Abdomen: Soft,  nontender, no hepatomegaly, bowel sounds present, no guarding or rebound. Extremities: No pitting edema, distal pulses 2+. Skin: Warm and dry. Musculoskeletal: No kyphosis. Neuropsychiatric: Alert and oriented x3, affect grossly appropriate.  Recent Labwork: No results found for requested labs within last 365 days.  No results found for: "CHOL", "TRIG", "HDL", "CHOLHDL", "VLDL", "LDLCALC", "LDLDIRECT"   Assessment and Plan:  Preop cardiac risk stratification for knee surgery: No angina or DOE.  EKG showed NSR, nonspecific T wave abnormality in the lead III, no preexcitation and normal QTc interval.  METS more than 4 (4.40 METS) according to [facility] activity status index.  Patient is at low risk for any perioperative cardiac or medications.  No further cardiac testing is indicated prior to proceeding with the planned procedure.  Screening of CAD: Patient will think about CT calcium scoring coronaries.  Obtain lipoprotein a levels.  Chronic venous insufficiency of left lower extremity: Venous duplex study in 2020 showed incompetent veins in the left leg.  I recommended compression stockings.  Patient continues to have swelling despite using compression stockings.  Recommended to make an appointment with vein and vascular for further treatment.  HTN, controlled: Continue HCTZ 25 mg once daily, follows with PCP.    Medication Adjustments/Labs and Tests Ordered: Current medicines are reviewed at length with the patient today.  Concerns regarding medicines are outlined above.    Disposition:  Follow up pending results.  Signed, Oveda Dadamo Verne Spurr, MD, 06/07/2023 12:51 PM    [facility] at [facility] [address], [place], [place] [address]

## 2023-06-09 LAB — LIPOPROTEIN A (LPA): Lipoprotein (a): 134.8 nmol/L — ABNORMAL HIGH (ref <75.0)

## 2023-06-12 ENCOUNTER — Telehealth: Payer: Self-pay

## 2023-06-12 MED ORDER — ROSUVASTATIN CALCIUM 10 MG PO TABS: 10.0000 mg | ORAL_TABLET | Freq: Every day | ORAL | 3 refills | Status: AC

## 2023-06-12 NOTE — Telephone Encounter (Signed)
-----   Message from Vishnu P Mallipeddi sent at 06/11/2023  2:40 PM EDT ----- Lipoprotein-a levels are elevated, LDL 113. Start p.o rosuvastatin 10 mg at bedtime.  Heart healthy diet and exercise recommendations (30 minutes a day for 5 days a week). Recommend screening of first-degree relatives (siblings, children, parents) with lipoprotein a.  If elevated, needs to be started on a statin in addition to maintaining healthy lifestyle, as stated above.

## 2023-06-12 NOTE — Telephone Encounter (Signed)
Patient notified and verbalized understanding. Pt had no questions or concerns at this time. PCP copied.  ?

## 2023-06-14 ENCOUNTER — Encounter (HOSPITAL_COMMUNITY): Payer: Self-pay

## 2023-06-14 ENCOUNTER — Ambulatory Visit (HOSPITAL_COMMUNITY)
Admission: RE | Admit: 2023-06-14 | Discharge: 2023-06-14 | Disposition: A | Discharge status: Home / Self Care | Payer: Medicare Other | Source: Ambulatory Visit | Attending: Family Medicine | Admitting: Family Medicine

## 2023-06-14 DIAGNOSIS — Z1231 Encounter for screening mammogram for malignant neoplasm of breast: Secondary | ICD-10-CM | POA: Diagnosis not present

## 2023-06-20 ENCOUNTER — Telehealth: Payer: Self-pay | Admitting: Internal Medicine

## 2023-06-20 NOTE — Telephone Encounter (Signed)
   Patient Name: Jodi Duran  DOB: 03/26/1956 MRN: [id number]  Primary Cardiologist: Marjo Bicker, MD  Chart reviewed as part of pre-operative protocol coverage. Given past medical history and time since last visit, based on ACC/AHA guidelines, Jodi Duran is at acceptable risk for the planned procedure without further cardiovascular testing.    Per Dr. Jenene Slicker 06/07/2023:   Preop cardiac risk stratification for knee surgery: No angina or DOE.  EKG showed NSR, nonspecific T wave abnormality in the lead III, no preexcitation and normal QTc interval.  METS more than 4 (4.40 METS) according to [facility] activity status index.  Patient is at low risk for any perioperative cardiac or medications.  No further cardiac testing is indicated prior to proceeding with the planned procedure.    I will route this recommendation to the requesting party via Epic fax function and remove from pre-op pool.  Please call with questions.  [name], NP 06/20/2023, 12:51 PM

## 2023-06-20 NOTE — Telephone Encounter (Signed)
   Pre-operative Risk Assessment    Patient Name: Jodi Duran  DOB: August 12, 1956 MRN: [id number]      Request for Surgical Clearance    Procedure:   Left Total Knee Replacement  Date of Surgery:  Clearance TBD                                 Surgeon:  Dr. Weber Cooks Surgeon's Group or Practice Name:  [facility] Phone number: [phone] Fax number: [phone]    Type of Clearance Requested:   - Medical    Type of Anesthesia:  Not Indicated   Additional requests/questions:  Please fax a copy of medical clearance to the surgeon's office.  Norva Pavlov Pough   06/20/2023, 8:45 AM

## 2023-07-21 ENCOUNTER — Ambulatory Visit: Payer: BC Managed Care – PPO | Admitting: Internal Medicine

## 2023-07-25 DIAGNOSIS — M1712 Unilateral primary osteoarthritis, left knee: Secondary | ICD-10-CM | POA: Diagnosis not present

## 2023-07-27 ENCOUNTER — Ambulatory Visit: Payer: Self-pay | Admitting: Emergency Medicine

## 2023-07-27 DIAGNOSIS — G8929 Other chronic pain: Secondary | ICD-10-CM

## 2023-07-27 NOTE — Care Plan (Signed)
Ortho Bundle Case Management Note  Patient Details  Name: Jodi Duran MRN: [id number] Date of Birth: 09/20/1955  patient seen in the office for H&P. will discharge to home with family assistance. rolling walker and CPM ordered. OPPT set up with [facility]. discharge instructions discussed and questions answered.  Patient and MD in agreement with plan. Choice offered.                     DME Arranged:  Dan Humphreys rolling, CPM DME Agency:  [employer]  HH Arranged:    HH Agency:     Additional Comments: Please contact me with any questions of if this plan should need to change.  Shauna Hugh,  RN,BSN,MHA,CCM  [facility]  [phone] 07/27/2023, 2:22 PM

## 2023-07-27 NOTE — H&P (Signed)
TOTAL KNEE ADMISSION H&P  Patient is being admitted for left total knee arthroplasty.  Subjective:  Chief Complaint:left knee pain.  HPI: Jodi Duran, 67 y.o. female, has a history of pain and functional disability in the left knee due to arthritis and has failed non-surgical conservative treatments for greater than 12 weeks to includeNSAID's and/or analgesics, corticosteriod injections, viscosupplementation injections, supervised PT with diminished ADL's post treatment, use of assistive devices, and activity modification.  Onset of symptoms was gradual, starting 5 years ago with gradually worsening course since that time. The patient noted no past surgery on the left knee(s).  Patient currently rates pain in the left knee(s) at 10 out of 10 with activity. Patient has night pain, worsening of pain with activity and weight bearing, pain that interferes with activities of daily living, and pain with passive range of motion.  Patient has evidence of periarticular osteophytes and joint space narrowing by imaging studies.  There is no active infection.  Patient Active Problem List   Diagnosis Date Noted   Chronic venous insufficiency of lower extremity 06/07/2023   Preoperative cardiovascular examination 06/07/2023   Pain in left knee 11/18/2019   Obesity (BMI 30-39.9) 10/23/2019   COVID-19 virus detected 10/23/2019   Essential hypertension 05/21/2019   Baker's cyst of knee, left 05/21/2019   Swelling of limb 05/21/2019   Screening for cardiovascular condition 06/18/2014   Corneal ulcer 02/11/2013   SHOULDER PAIN 11/13/2007   IMPINGEMENT SYNDROME 11/13/2007   Past Medical History:  Diagnosis Date   Arthritis    Hypertension     Past Surgical History:  Procedure Laterality Date   COLONOSCOPY  06/21/2007   [id number] internal hemorrhoids/Large amount of liquid stool seen in the transverse colon and descending colon which contained particulate matter   COLONOSCOPY N/A 07/11/2014    Procedure: COLONOSCOPY;  Surgeon: West Bali, MD;  Location: [facility];  Service: Endoscopy;  Laterality: N/A;  10:30  - moved to 10:45 - Ginger to notify pt    Current Outpatient Medications  Medication Sig Dispense Refill Last Dose   Conj Estrogens-Bazedoxifene 0.45-20 MG TABS Take 1 tablet by mouth daily.      cyclobenzaprine (FLEXERIL) 5 MG tablet Take 5 mg by mouth 3 (three) times daily as needed for muscle spasms.      cyclobenzaprine (FLEXERIL) 5 MG tablet Take 1 tablet (5 mg total) by mouth 3 (three) times daily as needed for muscle spasms. Do not drink alcohol or drive while taking this medication.  May cause drowsiness. 15 tablet 0    ENBREL SURECLICK 50 MG/ML injection Inject into the skin once a week.      hydrochlorothiazide (HYDRODIURIL) 25 MG tablet Take 25 mg by mouth daily.      hydrochlorothiazide (HYDRODIURIL) 25 MG tablet Take 25 mg by mouth daily.      HYDROcodone-acetaminophen (NORCO/VICODIN) 5-325 MG tablet Take 1 tablet by mouth every 6 (six) hours as needed for severe pain. (Patient not taking: Reported on 06/07/2023) 6 tablet 0    HYDROcodone-acetaminophen (NORCO/VICODIN) 5-325 MG tablet Take 1 tablet by mouth every 6 (six) hours as needed for severe pain. 15 tablet 0    naproxen (NAPROSYN) 500 MG tablet Take 500 mg by mouth 2 (two) times daily.      Naproxen-Esomeprazole 500-20 MG TBEC Take by mouth.      predniSONE (DELTASONE) 10 MG tablet TAKE 1 TABLET BY MOUTH THREE TIMES DAILY 42 tablet 0    rosuvastatin (CRESTOR) 10 MG tablet  Take 1 tablet (10 mg total) by mouth daily. 90 tablet 3    traMADol (ULTRAM) 50 MG tablet Take 50 mg by mouth 2 (two) times daily.      No current facility-administered medications for this visit.   Allergies  Allergen Reactions   Meloxicam Other (See Comments)   Methotrexate Other (See Comments)    Social History   Tobacco Use   Smoking status: Former    Current packs/day: 0.25    Average packs/day: 0.3 packs/day for 15.0  years (3.8 ttl pk-yrs)    Types: Cigarettes   Smokeless tobacco: Never  Substance Use Topics   Alcohol use: Yes    Comment: Socially    Family History  Problem Relation Age of Onset   Diabetes Mother    Congestive Heart Failure Mother      Review of Systems  Musculoskeletal:  Positive for arthralgias.  All other systems reviewed and are negative.   Objective:  Physical Exam Constitutional:      General: She is not in acute distress.    Appearance: Normal appearance. She is not ill-appearing.  HENT:     Head: Normocephalic and atraumatic.     Right Ear: External ear normal.     Left Ear: External ear normal.     Nose: Nose normal.     Mouth/Throat:     Mouth: Mucous membranes are moist.     Pharynx: Oropharynx is clear.  Eyes:     Extraocular Movements: Extraocular movements intact.     Conjunctiva/sclera: Conjunctivae normal.  Cardiovascular:     Rate and Rhythm: Normal rate and regular rhythm.     Pulses: Normal pulses.     Heart sounds: Normal heart sounds.  Pulmonary:     Effort: Pulmonary effort is normal.     Breath sounds: Normal breath sounds.  Abdominal:     General: Bowel sounds are normal.     Palpations: Abdomen is soft.     Tenderness: There is no abdominal tenderness.  Musculoskeletal:        General: Tenderness present.     Cervical back: Normal range of motion and neck supple.     Comments: TTP over medial and lateral joint line, medial worse than lateral.  No calf tenderness, swelling, or erythema.  No overlying lesions of area of chief complaint.  Decreased strength and ROM due to elicited pain.  Dorsiflexion and plantarflexion intact.  Stable to varus and valgus stress.  BLE appear grossly neurovascularly intact.  Gait mildly antalgic.   Skin:    General: Skin is warm and dry.  Neurological:     Mental Status: She is alert and oriented to person, place, and time. Mental status is at baseline.  Psychiatric:        Mood and Affect: Mood normal.         Behavior: Behavior normal.     Vital signs in last 24 hours: @VSRANGES @  Labs:   Estimated body mass index is 40.28 kg/m as calculated from the following:   Height as of 06/07/23: 5\' 7"  (1.702 m).   Weight as of 06/07/23: 116.7 kg.   Imaging Review Plain radiographs demonstrate severe degenerative joint disease of the left knee(s). The overall alignment is generally neutral with severe bone-on-bone articulation medial and lateral compartments . The bone quality appears to be fair for age and reported activity level.      Assessment/Plan:  End stage arthritis, left knee   The patient history, physical examination, clinical  judgment of the provider and imaging studies are consistent with end stage degenerative joint disease of the left knee(s) and total knee arthroplasty is deemed medically necessary. The treatment options including medical management, injection therapy arthroscopy and arthroplasty were discussed at length. The risks and benefits of total knee arthroplasty were presented and reviewed. The risks due to aseptic loosening, infection, stiffness, patella tracking problems, thromboembolic complications and other imponderables were discussed. The patient acknowledged the explanation, agreed to proceed with the plan and consent was signed. Patient is being admitted for inpatient treatment for surgery, pain control, PT, OT, prophylactic antibiotics, VTE prophylaxis, progressive ambulation and ADL's and discharge planning. The patient is planning to be discharged home with outpatient PT.    Anticipated LOS equal to or greater than 2 midnights due to - Age 35 and older with one or more of the following:  - Obesity  - Expected need for hospital services (PT, OT, Nursing) required for safe  discharge  - Anticipated need for postoperative skilled nursing care or inpatient rehab  - Active co-morbidities: HTN, HLD, chronic LE venous insufficiency, rheumatoid arthritis OR    - Unanticipated findings during/Post Surgery: None  - Patient is a high risk of re-admission due to: None

## 2023-07-27 NOTE — H&P (View-Only) (Signed)
 TOTAL KNEE ADMISSION H&P  Patient is being admitted for left total knee arthroplasty.  Subjective:  Chief Complaint:left knee pain.  HPI: Jodi Duran, 67 y.o. female, has a history of pain and functional disability in the left knee due to arthritis and has failed non-surgical conservative treatments for greater than 12 weeks to includeNSAID's and/or analgesics, corticosteriod injections, viscosupplementation injections, supervised PT with diminished ADL's post treatment, use of assistive devices, and activity modification.  Onset of symptoms was gradual, starting 5 years ago with gradually worsening course since that time. The patient noted no past surgery on the left knee(s).  Patient currently rates pain in the left knee(s) at 10 out of 10 with activity. Patient has night pain, worsening of pain with activity and weight bearing, pain that interferes with activities of daily living, and pain with passive range of motion.  Patient has evidence of periarticular osteophytes and joint space narrowing by imaging studies.  There is no active infection.  Patient Active Problem List   Diagnosis Date Noted   Chronic venous insufficiency of lower extremity 06/07/2023   Preoperative cardiovascular examination 06/07/2023   Pain in left knee 11/18/2019   Obesity (BMI 30-39.9) 10/23/2019   COVID-19 virus detected 10/23/2019   Essential hypertension 05/21/2019   Baker's cyst of knee, left 05/21/2019   Swelling of limb 05/21/2019   Screening for cardiovascular condition 06/18/2014   Corneal ulcer 02/11/2013   SHOULDER PAIN 11/13/2007   IMPINGEMENT SYNDROME 11/13/2007   Past Medical History:  Diagnosis Date   Arthritis    Hypertension     Past Surgical History:  Procedure Laterality Date   COLONOSCOPY  06/21/2007   [id number] internal hemorrhoids/Large amount of liquid stool seen in the transverse colon and descending colon which contained particulate matter   COLONOSCOPY N/A 07/11/2014    Procedure: COLONOSCOPY;  Surgeon: West Bali, MD;  Location: [facility];  Service: Endoscopy;  Laterality: N/A;  10:30  - moved to 10:45 - Ginger to notify pt    Current Outpatient Medications  Medication Sig Dispense Refill Last Dose   Conj Estrogens-Bazedoxifene 0.45-20 MG TABS Take 1 tablet by mouth daily.      cyclobenzaprine (FLEXERIL) 5 MG tablet Take 5 mg by mouth 3 (three) times daily as needed for muscle spasms.      cyclobenzaprine (FLEXERIL) 5 MG tablet Take 1 tablet (5 mg total) by mouth 3 (three) times daily as needed for muscle spasms. Do not drink alcohol or drive while taking this medication.  May cause drowsiness. 15 tablet 0    ENBREL SURECLICK 50 MG/ML injection Inject into the skin once a week.      hydrochlorothiazide (HYDRODIURIL) 25 MG tablet Take 25 mg by mouth daily.      hydrochlorothiazide (HYDRODIURIL) 25 MG tablet Take 25 mg by mouth daily.      HYDROcodone-acetaminophen (NORCO/VICODIN) 5-325 MG tablet Take 1 tablet by mouth every 6 (six) hours as needed for severe pain. (Patient not taking: Reported on 06/07/2023) 6 tablet 0    HYDROcodone-acetaminophen (NORCO/VICODIN) 5-325 MG tablet Take 1 tablet by mouth every 6 (six) hours as needed for severe pain. 15 tablet 0    naproxen (NAPROSYN) 500 MG tablet Take 500 mg by mouth 2 (two) times daily.      Naproxen-Esomeprazole 500-20 MG TBEC Take by mouth.      predniSONE (DELTASONE) 10 MG tablet TAKE 1 TABLET BY MOUTH THREE TIMES DAILY 42 tablet 0    rosuvastatin (CRESTOR) 10 MG tablet  Take 1 tablet (10 mg total) by mouth daily. 90 tablet 3    traMADol (ULTRAM) 50 MG tablet Take 50 mg by mouth 2 (two) times daily.      No current facility-administered medications for this visit.   Allergies  Allergen Reactions   Meloxicam Other (See Comments)   Methotrexate Other (See Comments)    Social History   Tobacco Use   Smoking status: Former    Current packs/day: 0.25    Average packs/day: 0.3 packs/day for 15.0  years (3.8 ttl pk-yrs)    Types: Cigarettes   Smokeless tobacco: Never  Substance Use Topics   Alcohol use: Yes    Comment: Socially    Family History  Problem Relation Age of Onset   Diabetes Mother    Congestive Heart Failure Mother      Review of Systems  Musculoskeletal:  Positive for arthralgias.  All other systems reviewed and are negative.   Objective:  Physical Exam Constitutional:      General: She is not in acute distress.    Appearance: Normal appearance. She is not ill-appearing.  HENT:     Head: Normocephalic and atraumatic.     Right Ear: External ear normal.     Left Ear: External ear normal.     Nose: Nose normal.     Mouth/Throat:     Mouth: Mucous membranes are moist.     Pharynx: Oropharynx is clear.  Eyes:     Extraocular Movements: Extraocular movements intact.     Conjunctiva/sclera: Conjunctivae normal.  Cardiovascular:     Rate and Rhythm: Normal rate and regular rhythm.     Pulses: Normal pulses.     Heart sounds: Normal heart sounds.  Pulmonary:     Effort: Pulmonary effort is normal.     Breath sounds: Normal breath sounds.  Abdominal:     General: Bowel sounds are normal.     Palpations: Abdomen is soft.     Tenderness: There is no abdominal tenderness.  Musculoskeletal:        General: Tenderness present.     Cervical back: Normal range of motion and neck supple.     Comments: TTP over medial and lateral joint line, medial worse than lateral.  No calf tenderness, swelling, or erythema.  No overlying lesions of area of chief complaint.  Decreased strength and ROM due to elicited pain.  Dorsiflexion and plantarflexion intact.  Stable to varus and valgus stress.  BLE appear grossly neurovascularly intact.  Gait mildly antalgic.   Skin:    General: Skin is warm and dry.  Neurological:     Mental Status: She is alert and oriented to person, place, and time. Mental status is at baseline.  Psychiatric:        Mood and Affect: Mood normal.         Behavior: Behavior normal.     Vital signs in last 24 hours: @VSRANGES @  Labs:   Estimated body mass index is 40.28 kg/m as calculated from the following:   Height as of 06/07/23: 5\' 7"  (1.702 m).   Weight as of 06/07/23: 116.7 kg.   Imaging Review Plain radiographs demonstrate severe degenerative joint disease of the left knee(s). The overall alignment is generally neutral with severe bone-on-bone articulation medial and lateral compartments . The bone quality appears to be fair for age and reported activity level.      Assessment/Plan:  End stage arthritis, left knee   The patient history, physical examination, clinical  judgment of the provider and imaging studies are consistent with end stage degenerative joint disease of the left knee(s) and total knee arthroplasty is deemed medically necessary. The treatment options including medical management, injection therapy arthroscopy and arthroplasty were discussed at length. The risks and benefits of total knee arthroplasty were presented and reviewed. The risks due to aseptic loosening, infection, stiffness, patella tracking problems, thromboembolic complications and other imponderables were discussed. The patient acknowledged the explanation, agreed to proceed with the plan and consent was signed. Patient is being admitted for inpatient treatment for surgery, pain control, PT, OT, prophylactic antibiotics, VTE prophylaxis, progressive ambulation and ADL's and discharge planning. The patient is planning to be discharged home with outpatient PT.    Anticipated LOS equal to or greater than 2 midnights due to - Age 35 and older with one or more of the following:  - Obesity  - Expected need for hospital services (PT, OT, Nursing) required for safe  discharge  - Anticipated need for postoperative skilled nursing care or inpatient rehab  - Active co-morbidities: HTN, HLD, chronic LE venous insufficiency, rheumatoid arthritis OR    - Unanticipated findings during/Post Surgery: None  - Patient is a high risk of re-admission due to: None

## 2023-08-08 NOTE — Patient Instructions (Signed)
SURGICAL WAITING ROOM VISITATION  Patients having surgery or a procedure may have no more than 2 support people in the waiting area - these visitors may rotate.    Children under the age of 54 must have an adult with them who is not the patient.  Due to an increase in RSV and influenza rates and associated hospitalizations, children ages 65 and under may not visit patients in [facility] hospitals.  If the patient needs to stay at the hospital during part of their recovery, the visitor guidelines for inpatient rooms apply. Pre-op nurse will coordinate an appropriate time for 1 support person to accompany patient in pre-op.  This support person may not rotate.    Please refer to the [facility] website for the visitor guidelines for Inpatients (after your surgery is over and you are in a regular room).       Your procedure is scheduled on: 08/21/23   Report to [facility] Main Entrance    Report to admitting at 5:15 AM   Call this number if you have problems the morning of surgery [phone]   Do not eat food :After Midnight.   After Midnight you may have the following liquids until 4:30 AM DAY OF SURGERY  Water Non-Citrus Juices (without pulp, NO RED-Apple, White grape, White cranberry) Black Coffee (NO MILK/CREAM OR CREAMERS, sugar ok)  Clear Tea (NO MILK/CREAM OR CREAMERS, sugar ok) regular and decaf                             Plain Jell-O (NO RED)                                           Fruit ices (not with fruit pulp, NO RED)                                     Popsicles (NO RED)                                                               Sports drinks like Gatorade (NO RED)                  The day of surgery:  Drink ONE (1) Pre-Surgery Clear Ensure at 4:30  AM the morning of surgery. Drink in one sitting. Do not sip.  This drink was given to you during your hospital  pre-op appointment visit. Nothing else to drink after completing the  Pre-Surgery Clear  Ensure  May wear dentures to hospital but need to remove them at time of surgery. No adhesives    Oral Hygiene is also important to reduce your risk of infection.                                    Remember - BRUSH YOUR TEETH THE MORNING OF SURGERY WITH YOUR REGULAR TOOTHPASTE   Stop all vitamins and herbal supplements 7 days before surgery.   Take these medicines  the morning of surgery with A SIP OF WATER: Rosuvastatin, Tylenol if needed.             You may not have any metal on your body including hair pins, jewelry, and body piercing             Do not wear make-up, lotions, powders, perfumes/cologne, or deodorant  Do not wear nail polish including gel and S&S, artificial/acrylic nails, or any other type of covering on natural nails including finger and toenails. If you have artificial nails, gel coating, etc. that needs to be removed by a nail salon please have this removed prior to surgery or surgery may need to be canceled/ delayed if the surgeon/ anesthesia feels like they are unable to be safely monitored.   Do not shave  48 hours prior to surgery.               Men may shave face and neck.   Do not bring valuables to the hospital. [facility] IS NOT             RESPONSIBLE   FOR VALUABLES.   Contacts, glasses, dentures or bridgework may not be worn into surgery.   Bring small overnight bag day of surgery.   DO NOT BRING YOUR HOME MEDICATIONS TO THE HOSPITAL. PHARMACY WILL DISPENSE MEDICATIONS LISTED ON YOUR MEDICATION LIST TO YOU DURING YOUR ADMISSION IN THE HOSPITAL!    Patients discharged on the day of surgery will not be allowed to drive home.  Someone NEEDS to stay with you for the first 24 hours after anesthesia.   Special Instructions: Bring a copy of your healthcare power of attorney and living will documents the day of surgery if you haven't scanned them before.              Please read over the following fact sheets you were given: IF YOU HAVE QUESTIONS ABOUT YOUR  PRE-OP INSTRUCTIONS PLEASE CALL [phone] [facility]   If you received a COVID test during your pre-op visit  it is requested that you wear a mask when out in public, stay away from anyone that may not be feeling well and notify your surgeon if you develop symptoms. If you test positive for Covid or have been in contact with anyone that has tested positive in the last 10 days please notify you surgeon.      Pre-operative 5 CHG Bath Instructions   You can play a key role in reducing the risk of infection after surgery. Your skin needs to be as free of germs as possible. You can reduce the number of germs on your skin by washing with CHG (chlorhexidine gluconate) soap before surgery. CHG is an antiseptic soap that kills germs and continues to kill germs even after washing.   DO NOT use if you have an allergy to chlorhexidine/CHG or antibacterial soaps. If your skin becomes reddened or irritated, stop using the CHG and notify one of our RNs at [phone].   Please shower with the CHG soap starting 4 days before surgery using the following schedule:     Please keep in mind the following:  DO NOT shave, including legs and underarms, starting the day of your first shower.   You may shave your face at any point before/day of surgery.  Place clean sheets on your bed the day you start using CHG soap. Use a clean washcloth (not used since being washed) for each shower. DO NOT sleep with pets once you  start using the CHG.   CHG Shower Instructions:  If you choose to wash your hair and private area, wash first with your normal shampoo/soap.  After you use shampoo/soap, rinse your hair and body thoroughly to remove shampoo/soap residue.  Turn the water OFF and apply about 3 tablespoons (45 ml) of CHG soap to a CLEAN washcloth.  Apply CHG soap ONLY FROM YOUR NECK DOWN TO YOUR TOES (washing for 3-5 minutes)  DO NOT use CHG soap on face, private areas, open wounds, or sores.  Pay special attention to  the area where your surgery is being performed.  If you are having back surgery, having someone wash your back for you may be helpful. Wait 2 minutes after CHG soap is applied, then you may rinse off the CHG soap.  Pat dry with a clean towel  Put on clean clothes/pajamas   If you choose to wear lotion, please use ONLY the CHG-compatible lotions on the back of this paper.     Additional instructions for the day of surgery: DO NOT APPLY any lotions, deodorants, cologne, or perfumes.   Put on clean/comfortable clothes.  Brush your teeth.  Ask your nurse before applying any prescription medications to the skin.      CHG Compatible Lotions   Aveeno Moisturizing lotion  Cetaphil Moisturizing Cream  Cetaphil Moisturizing Lotion  Clairol Herbal Essence Moisturizing Lotion, Dry Skin  Clairol Herbal Essence Moisturizing Lotion, Extra Dry Skin  Clairol Herbal Essence Moisturizing Lotion, Normal Skin  Curel Age Defying Therapeutic Moisturizing Lotion with Alpha Hydroxy  Curel Extreme Care Body Lotion  Curel Soothing Hands Moisturizing Hand Lotion  Curel Therapeutic Moisturizing Cream, Fragrance-Free  Curel Therapeutic Moisturizing Lotion, Fragrance-Free  Curel Therapeutic Moisturizing Lotion, Original Formula  Eucerin Daily Replenishing Lotion  Eucerin Dry Skin Therapy Plus Alpha Hydroxy Crme  Eucerin Dry Skin Therapy Plus Alpha Hydroxy Lotion  Eucerin Original Crme  Eucerin Original Lotion  Eucerin Plus Crme Eucerin Plus Lotion  Eucerin TriLipid Replenishing Lotion  Keri Anti-Bacterial Hand Lotion  Keri Deep Conditioning Original Lotion Dry Skin Formula Softly Scented  Keri Deep Conditioning Original Lotion, Fragrance Free Sensitive Skin Formula  Keri Lotion Fast Absorbing Fragrance Free Sensitive Skin Formula  Keri Lotion Fast Absorbing Softly Scented Dry Skin Formula  Keri Original Lotion  Keri Skin Renewal Lotion Keri Silky Smooth Lotion  Keri Silky Smooth Sensitive Skin  Lotion  Nivea Body Creamy Conditioning Oil  Nivea Body Extra Enriched Lotion  Nivea Body Original Lotion  Nivea Body Sheer Moisturizing Lotion Nivea Crme  Nivea Skin Firming Lotion  NutraDerm 30 Skin Lotion  NutraDerm Skin Lotion  NutraDerm Therapeutic Skin Cream  NutraDerm Therapeutic Skin Lotion  ProShield Protective Hand Cream   Incentive Spirometer (Watch this video at home: [email or web address])  An incentive spirometer is a tool that can help keep your lungs clear and active. This tool measures how well you are filling your lungs with each breath. Taking long deep breaths may help reverse or decrease the chance of developing breathing (pulmonary) problems (especially infection) following: A long period of time when you are unable to move or be active. BEFORE THE PROCEDURE  If the spirometer includes an indicator to show your best effort, your nurse or respiratory therapist will set it to a desired goal. If possible, sit up straight or lean slightly forward. Try not to slouch. Hold the incentive spirometer in an upright position. INSTRUCTIONS FOR USE  Sit on the edge of your bed if possible, or sit up  as far as you can in bed or on a chair. Hold the incentive spirometer in an upright position. Breathe out normally. Place the mouthpiece in your mouth and seal your lips tightly around it. Breathe in slowly and as deeply as possible, raising the piston or the ball toward the top of the column. Hold your breath for 3-5 seconds or for as long as possible. Allow the piston or ball to fall to the bottom of the column. Remove the mouthpiece from your mouth and breathe out normally. Rest for a few seconds and repeat Steps 1 through 7 at least 10 times every 1-2 hours when you are awake. Take your time and take a few normal breaths between deep breaths. The spirometer may include an indicator to show your best effort. Use the indicator as a goal to work toward during  each repetition. After each set of 10 deep breaths, practice coughing to be sure your lungs are clear. If you have an incision (the cut made at the time of surgery), support your incision when coughing by placing a pillow or rolled up towels firmly against it. Once you are able to get out of bed, walk around indoors and cough well. You may stop using the incentive spirometer when instructed by your caregiver.  RISKS AND COMPLICATIONS Take your time so you do not get dizzy or light-headed. If you are in pain, you may need to take or ask for pain medication before doing incentive spirometry. It is harder to take a deep breath if you are having pain. AFTER USE Rest and breathe slowly and easily. It can be helpful to keep track of a log of your progress. Your caregiver can provide you with a simple table to help with this. If you are using the spirometer at home, follow these instructions: SEEK MEDICAL CARE IF:  You are having difficultly using the spirometer. You have trouble using the spirometer as often as instructed. Your pain medication is not giving enough relief while using the spirometer. You develop fever of 100.5 F (38.1 C) or higher. SEEK IMMEDIATE MEDICAL CARE IF:  You cough up bloody sputum that had not been present before. You develop fever of 102 F (38.9 C) or greater. You develop worsening pain at or near the incision site. MAKE SURE YOU:  Understand these instructions. Will watch your condition. Will get help right away if you are not doing well or get worse   WHAT IS A BLOOD TRANSFUSION? Blood Transfusion Information  A transfusion is the replacement of blood or some of its parts. Blood is made up of multiple cells which provide different functions. Red blood cells carry oxygen and are used for blood loss replacement. White blood cells fight against infection. Platelets control bleeding. Plasma helps clot blood. Other blood products are available for specialized  needs, such as hemophilia or other clotting disorders. BEFORE THE TRANSFUSION  Who gives blood for transfusions?  Healthy volunteers who are fully evaluated to make sure their blood is safe. This is blood bank blood. Transfusion therapy is the safest it has ever been in the practice of medicine. Before blood is taken from a donor, a complete history is taken to make sure that person has no history of diseases nor engages in risky social behavior (examples are intravenous drug use or sexual activity with multiple partners). The donor's travel history is screened to minimize risk of transmitting infections, such as malaria. The donated blood is tested for signs of infectious diseases, such  as HIV and hepatitis. The blood is then tested to be sure it is compatible with you in order to minimize the chance of a transfusion reaction. If you or a relative donates blood, this is often done in anticipation of surgery and is not appropriate for emergency situations. It takes many days to process the donated blood. RISKS AND COMPLICATIONS Although transfusion therapy is very safe and saves many lives, the main dangers of transfusion include:  Getting an infectious disease. Developing a transfusion reaction. This is an allergic reaction to something in the blood you were given. Every precaution is taken to prevent this. The decision to have a blood transfusion has been considered carefully by your caregiver before blood is given. Blood is not given unless the benefits outweigh the risks. AFTER THE TRANSFUSION Right after receiving a blood transfusion, you will usually feel much better and more energetic. This is especially true if your red blood cells have gotten low (anemic). The transfusion raises the level of the red blood cells which carry oxygen, and this usually causes an energy increase. The nurse administering the transfusion will monitor you carefully for complications. HOME CARE INSTRUCTIONS  No special  instructions are needed after a transfusion. You may find your energy is better. Speak with your caregiver about any limitations on activity for underlying diseases you may have. SEEK MEDICAL CARE IF:  Your condition is not improving after your transfusion. You develop redness or irritation at the intravenous (IV) site. SEEK IMMEDIATE MEDICAL CARE IF:  Any of the following symptoms occur over the next 12 hours: Shaking chills. You have a temperature by mouth above 102 F (38.9 C), not controlled by medicine. Chest, back, or muscle pain. People around you feel you are not acting correctly or are confused. Shortness of breath or difficulty breathing. Dizziness and fainting. You get a rash or develop hives. You have a decrease in urine output. Your urine turns a dark color or changes to pink, red, or brown. Any of the following symptoms occur over the next 10 days: You have a temperature by mouth above 102 F (38.9 C), not controlled by medicine. Shortness of breath. Weakness after normal activity. The white part of the eye turns yellow (jaundice). You have a decrease in the amount of urine or are urinating less often. Your urine turns a dark color or changes to pink, red, or brown. Document Released: 08/26/2000 Document Revised: 11/21/2011 Document Reviewed: 04/14/2008 [facility] Patient Information 2014 [place], [place].

## 2023-08-08 NOTE — Progress Notes (Signed)
COVID Vaccine received:  []  No [x]  Yes Date of any COVID positive Test in last 90 days: no PCP - Mirna Mires MD Cardiologist - Luane School MD  Chest x-ray -  EKG -  06/07/23 Epic Stress Test -  ECHO -  Cardiac Cath -   Cardiac Clearance 06/07/23 Dr. Hart Rochester Malipeddi  Bowel Prep - [x]  No  []   Yes ______  Pacemaker / ICD device [x]  No []  Yes   Spinal Cord Stimulator:[x]  No []  Yes       History of Sleep Apnea? [x]  No []  Yes   CPAP used?- [x]  No []  Yes    Does the patient monitor blood sugar?          [x]  No []  Yes  []  N/A  Patient has: [x]  NO Hx DM   []  Pre-DM                 []  DM1  []   DM2 Does patient have a [employer] or Dexacom? []  No []  Yes   Fasting Blood Sugar Ranges-  Checks Blood Sugar _____ times a day  GLP1 agonist / usual dose - no GLP1 instructions:  SGLT-2 inhibitors / usual dose - no SGLT-2 instructions:   Blood Thinner / Instructions:no Aspirin Instructions:81 mg ASA will stop 1 week prior to surgery per MD  Comments:   Activity level: Patient is able / to climb a flight of stairs without difficulty; [x]  No CP  [x]  No SOB, ___   Patient can perform ADLs without assistance.   Anesthesia review:   Patient denies shortness of breath, fever, cough and chest pain at PAT appointment.  Patient verbalized understanding and agreement to the Pre-Surgical Instructions that were given to them at this PAT appointment. Patient was also educated of the need to review these PAT instructions again prior to his/her surgery.I reviewed the appropriate phone numbers to call if they have any and questions or concerns.

## 2023-08-09 ENCOUNTER — Encounter (HOSPITAL_COMMUNITY)
Admission: RE | Admit: 2023-08-09 | Discharge: 2023-08-09 | Disposition: A | Discharge status: Home / Self Care | Payer: Medicare Other | Source: Ambulatory Visit | Attending: Orthopedic Surgery | Admitting: Orthopedic Surgery

## 2023-08-09 ENCOUNTER — Encounter (HOSPITAL_COMMUNITY): Payer: Self-pay

## 2023-08-09 ENCOUNTER — Other Ambulatory Visit: Payer: Self-pay

## 2023-08-09 VITALS — BP 130/95 | HR 97 | Temp 97.8°F | Resp 18 | Ht 67.0 in | Wt 252.0 lb

## 2023-08-09 DIAGNOSIS — M25562 Pain in left knee: Secondary | ICD-10-CM | POA: Insufficient documentation

## 2023-08-09 DIAGNOSIS — Z01812 Encounter for preprocedural laboratory examination: Secondary | ICD-10-CM | POA: Insufficient documentation

## 2023-08-09 DIAGNOSIS — Z01818 Encounter for other preprocedural examination: Secondary | ICD-10-CM

## 2023-08-09 DIAGNOSIS — G8929 Other chronic pain: Secondary | ICD-10-CM | POA: Diagnosis not present

## 2023-08-09 LAB — CBC WITH DIFFERENTIAL/PLATELET
Abs Immature Granulocytes: 0.01 10*3/uL (ref 0.00–0.07)
Basophils Absolute: 0 10*3/uL (ref 0.0–0.1)
Basophils Relative: 1 %
Eosinophils Absolute: 0.1 10*3/uL (ref 0.0–0.5)
Eosinophils Relative: 1 %
HCT: 36.5 % (ref 36.0–46.0)
Hemoglobin: 11.4 g/dL — ABNORMAL LOW (ref 12.0–15.0)
Immature Granulocytes: 0 %
Lymphocytes Relative: 25 %
Lymphs Abs: 1.6 10*3/uL (ref 0.7–4.0)
MCH: 28.4 pg (ref 26.0–34.0)
MCHC: 31.2 g/dL (ref 30.0–36.0)
MCV: 90.8 fL (ref 80.0–100.0)
Monocytes Absolute: 0.6 10*3/uL (ref 0.1–1.0)
Monocytes Relative: 9 %
Neutro Abs: 4.1 10*3/uL (ref 1.7–7.7)
Neutrophils Relative %: 64 %
Platelets: 274 10*3/uL (ref 150–400)
RBC: 4.02 MIL/uL (ref 3.87–5.11)
RDW: 13.2 % (ref 11.5–15.5)
WBC: 6.4 10*3/uL (ref 4.0–10.5)
nRBC: 0 % (ref 0.0–0.2)

## 2023-08-09 LAB — COMPREHENSIVE METABOLIC PANEL
ALT: 12 U/L (ref 0–44)
AST: 17 U/L (ref 15–41)
Albumin: 3.8 g/dL (ref 3.5–5.0)
Alkaline Phosphatase: 87 U/L (ref 38–126)
Anion gap: 10 (ref 5–15)
BUN: 12 mg/dL (ref 8–23)
CO2: 27 mmol/L (ref 22–32)
Calcium: 9.5 mg/dL (ref 8.9–10.3)
Chloride: 103 mmol/L (ref 98–111)
Creatinine, Ser: 0.76 mg/dL (ref 0.44–1.00)
GFR, Estimated: >60 mL/min (ref >60)
Glucose, Bld: 102 mg/dL — ABNORMAL HIGH (ref 70–99)
Potassium: 3.1 mmol/L — ABNORMAL LOW (ref 3.5–5.1)
Sodium: 140 mmol/L (ref 135–145)
Total Bilirubin: 0.8 mg/dL (ref <1.2)
Total Protein: 7.7 g/dL (ref 6.5–8.1)

## 2023-08-09 LAB — TYPE AND SCREEN
ABO/RH(D): A NEG
Antibody Screen: NEGATIVE

## 2023-08-09 LAB — SURGICAL PCR SCREEN
MRSA, PCR: NEGATIVE
Staphylococcus aureus: POSITIVE — AB

## 2023-08-09 NOTE — Progress Notes (Signed)
Please review pt's pre op PCR result from PST visit 08/09/23.

## 2023-08-20 NOTE — Anesthesia Preprocedure Evaluation (Signed)
Anesthesia Evaluation  Patient identified by MRN, date of birth, ID band Patient awake    Reviewed: Allergy & Precautions, NPO status , Patient's Chart, lab work & pertinent test results  History of Anesthesia Complications Negative for: history of anesthetic complications  Airway Mallampati: III  TM Distance: >3 FB Neck ROM: Full    Dental  (+) Dental Advisory Given, Partial Upper, Partial Lower   Pulmonary former smoker   Pulmonary exam normal        Cardiovascular hypertension, Pt. on medications Normal cardiovascular exam     Neuro/Psych negative neurological ROS  negative psych ROS   GI/Hepatic negative GI ROS, Neg liver ROS,,,  Endo/Other    Class 3 obesity  Renal/GU negative Renal ROS     Musculoskeletal  (+) Arthritis ,    Abdominal   Peds  Hematology negative hematology ROS (+)  Plt 274k    Anesthesia Other Findings   Reproductive/Obstetrics                             Anesthesia Physical Anesthesia Plan  ASA: 3  Anesthesia Plan: Spinal   Post-op Pain Management: Tylenol PO (pre-op)* and Regional block*   Induction:   PONV Risk Score and Plan: 2 and Treatment may vary due to age or medical condition and Propofol infusion  Airway Management Planned: Natural Airway and Simple Face Mask  Additional Equipment: None  Intra-op Plan:   Post-operative Plan:   Informed Consent: I have reviewed the patients History and Physical, chart, labs and discussed the procedure including the risks, benefits and alternatives for the proposed anesthesia with the patient or authorized representative who has indicated his/her understanding and acceptance.       Plan Discussed with: CRNA and Anesthesiologist  Anesthesia Plan Comments: (Labs reviewed, platelets acceptable. Discussed risks and benefits of spinal, including spinal/epidural hematoma, infection, failed block, and PDPH.  Patient expressed understanding and wished to proceed. )       Anesthesia Quick Evaluation

## 2023-08-21 ENCOUNTER — Observation Stay (HOSPITAL_COMMUNITY)
Admission: RE | Admit: 2023-08-21 | Discharge: 2023-08-22 | Disposition: A | Discharge status: Home / Self Care | Payer: Medicare Other | Attending: Orthopedic Surgery | Admitting: Orthopedic Surgery

## 2023-08-21 ENCOUNTER — Ambulatory Visit (HOSPITAL_COMMUNITY): Payer: Medicare Other | Admitting: Anesthesiology

## 2023-08-21 ENCOUNTER — Encounter (HOSPITAL_COMMUNITY): Payer: Self-pay | Admitting: Orthopedic Surgery

## 2023-08-21 ENCOUNTER — Encounter (HOSPITAL_COMMUNITY)
Admission: RE | Disposition: A | Discharge status: Home / Self Care | Payer: Self-pay | Source: Home / Self Care | Attending: Orthopedic Surgery

## 2023-08-21 ENCOUNTER — Observation Stay (HOSPITAL_COMMUNITY): Payer: Medicare Other

## 2023-08-21 ENCOUNTER — Other Ambulatory Visit: Payer: Self-pay

## 2023-08-21 DIAGNOSIS — I10 Essential (primary) hypertension: Secondary | ICD-10-CM | POA: Insufficient documentation

## 2023-08-21 DIAGNOSIS — Z96652 Presence of left artificial knee joint: Secondary | ICD-10-CM | POA: Diagnosis not present

## 2023-08-21 DIAGNOSIS — Z87891 Personal history of nicotine dependence: Secondary | ICD-10-CM | POA: Diagnosis not present

## 2023-08-21 DIAGNOSIS — Z471 Aftercare following joint replacement surgery: Secondary | ICD-10-CM | POA: Diagnosis not present

## 2023-08-21 DIAGNOSIS — M1712 Unilateral primary osteoarthritis, left knee: Secondary | ICD-10-CM | POA: Diagnosis not present

## 2023-08-21 DIAGNOSIS — Z8616 Personal history of COVID-19: Secondary | ICD-10-CM | POA: Diagnosis not present

## 2023-08-21 DIAGNOSIS — Z79899 Other long term (current) drug therapy: Secondary | ICD-10-CM | POA: Diagnosis not present

## 2023-08-21 DIAGNOSIS — G8918 Other acute postprocedural pain: Secondary | ICD-10-CM | POA: Diagnosis not present

## 2023-08-21 HISTORY — PX: TOTAL KNEE ARTHROPLASTY: SHX125

## 2023-08-21 LAB — ABO/RH: ABO/RH(D): A NEG

## 2023-08-21 SURGERY — ARTHROPLASTY, KNEE, TOTAL
Anesthesia: Spinal | Site: Knee | Laterality: Left

## 2023-08-21 MED ORDER — PROPOFOL 1000 MG/100ML IV EMUL
INTRAVENOUS | Status: AC
  Filled 2023-08-21: qty 100

## 2023-08-21 MED ORDER — PHENYLEPHRINE HCL (PRESSORS) 10 MG/ML IV SOLN
INTRAVENOUS | Status: AC
  Filled 2023-08-21: qty 1

## 2023-08-21 MED ORDER — BUPIVACAINE LIPOSOME 1.3 % IJ SUSP: 20.0000 mL | Freq: Once | INTRAMUSCULAR | Status: DC

## 2023-08-21 MED ORDER — PROPOFOL 1000 MG/100ML IV EMUL
INTRAVENOUS | Status: AC
Start: 2023-08-21 — End: ?
  Filled 2023-08-21: qty 100

## 2023-08-21 MED ORDER — ONDANSETRON HCL 4 MG/2ML IJ SOLN
INTRAMUSCULAR | Status: AC
  Filled 2023-08-21: qty 2

## 2023-08-21 MED ORDER — POLYETHYLENE GLYCOL 3350 17 G PO PACK: 17.0000 g | PACK | Freq: Every day | ORAL | 0 refills | Status: AC

## 2023-08-21 MED ORDER — ONDANSETRON HCL 4 MG PO TABS: 4.0000 mg | ORAL_TABLET | Freq: Three times a day (TID) | ORAL | 0 refills | Status: AC | PRN

## 2023-08-21 MED ORDER — SODIUM CHLORIDE (PF) 0.9 % IJ SOLN
INTRAMUSCULAR | Status: DC | PRN
  Administered 2023-08-21: 80 mL

## 2023-08-21 MED ORDER — POTASSIUM CHLORIDE ER 10 MEQ PO TBCR
10.0000 meq | EXTENDED_RELEASE_TABLET | Freq: Every day | ORAL | Status: DC
  Administered 2023-08-22: 10 meq via ORAL
  Filled 2023-08-21 (×2): qty 1

## 2023-08-21 MED ORDER — FENTANYL CITRATE (PF) 100 MCG/2ML IJ SOLN
INTRAMUSCULAR | Status: DC | PRN
  Administered 2023-08-21 (×2): 50 ug via INTRAVENOUS

## 2023-08-21 MED ORDER — OXYCODONE HCL 5 MG/5ML PO SOLN: 5.0000 mg | Freq: Once | ORAL | Status: AC | PRN

## 2023-08-21 MED ORDER — ONDANSETRON HCL 4 MG/2ML IJ SOLN
4.0000 mg | Freq: Four times a day (QID) | INTRAMUSCULAR | Status: DC | PRN
Start: 2023-08-21 — End: 2023-08-22

## 2023-08-21 MED ORDER — ACETAMINOPHEN 500 MG PO TABS
1000.0000 mg | ORAL_TABLET | Freq: Once | ORAL | Status: AC
  Administered 2023-08-21: 1000 mg via ORAL
  Filled 2023-08-21: qty 2

## 2023-08-21 MED ORDER — BUPIVACAINE LIPOSOME 1.3 % IJ SUSP
INTRAMUSCULAR | Status: AC
  Filled 2023-08-21: qty 20

## 2023-08-21 MED ORDER — CELECOXIB 100 MG PO CAPS: 100.0000 mg | ORAL_CAPSULE | Freq: Two times a day (BID) | ORAL | 0 refills | Status: AC

## 2023-08-21 MED ORDER — ORAL CARE MOUTH RINSE: 15.0000 mL | Freq: Once | OROMUCOSAL | Status: AC

## 2023-08-21 MED ORDER — PROPOFOL 10 MG/ML IV BOLUS
INTRAVENOUS | Status: DC | PRN
  Administered 2023-08-21: 40 mg via INTRAVENOUS

## 2023-08-21 MED ORDER — PHENOL 1.4 % MT LIQD: 1.0000 | OROMUCOSAL | Status: DC | PRN

## 2023-08-21 MED ORDER — OMEPRAZOLE 40 MG PO CPDR: 40.0000 mg | DELAYED_RELEASE_CAPSULE | Freq: Every day | ORAL | 0 refills | Status: AC

## 2023-08-21 MED ORDER — ISOPROPYL ALCOHOL 70 % SOLN
Status: DC | PRN
  Administered 2023-08-21: 1 via TOPICAL

## 2023-08-21 MED ORDER — ROSUVASTATIN CALCIUM 10 MG PO TABS
10.0000 mg | ORAL_TABLET | Freq: Every day | ORAL | Status: DC
  Administered 2023-08-22: 10 mg via ORAL
  Filled 2023-08-21: qty 1

## 2023-08-21 MED ORDER — MIDAZOLAM HCL 2 MG/2ML IJ SOLN
INTRAMUSCULAR | Status: AC
  Filled 2023-08-21: qty 2

## 2023-08-21 MED ORDER — ONDANSETRON HCL 4 MG/2ML IJ SOLN: 4.0000 mg | Freq: Once | INTRAMUSCULAR | Status: DC | PRN

## 2023-08-21 MED ORDER — LACTATED RINGERS IV SOLN: INTRAVENOUS | Status: AC

## 2023-08-21 MED ORDER — ASPIRIN 81 MG PO TBEC: 81.0000 mg | DELAYED_RELEASE_TABLET | Freq: Two times a day (BID) | ORAL | Status: AC

## 2023-08-21 MED ORDER — HYDROMORPHONE HCL 1 MG/ML IJ SOLN: 0.5000 mg | INTRAMUSCULAR | Status: DC | PRN

## 2023-08-21 MED ORDER — CEFAZOLIN SODIUM-DEXTROSE 2-4 GM/100ML-% IV SOLN
2.0000 g | Freq: Four times a day (QID) | INTRAVENOUS | Status: AC
  Administered 2023-08-21 (×2): 2 g via INTRAVENOUS
  Filled 2023-08-21 (×2): qty 100

## 2023-08-21 MED ORDER — PANTOPRAZOLE SODIUM 40 MG PO TBEC
40.0000 mg | DELAYED_RELEASE_TABLET | Freq: Every day | ORAL | Status: DC
  Administered 2023-08-21 – 2023-08-22 (×2): 40 mg via ORAL
  Filled 2023-08-21 (×2): qty 1

## 2023-08-21 MED ORDER — DEXAMETHASONE SODIUM PHOSPHATE 10 MG/ML IJ SOLN
INTRAMUSCULAR | Status: AC
  Filled 2023-08-21: qty 1

## 2023-08-21 MED ORDER — OXYCODONE HCL 5 MG PO TABS
5.0000 mg | ORAL_TABLET | Freq: Once | ORAL | Status: AC | PRN
  Administered 2023-08-21: 5 mg via ORAL

## 2023-08-21 MED ORDER — MENTHOL 3 MG MT LOZG: 1.0000 | LOZENGE | OROMUCOSAL | Status: DC | PRN

## 2023-08-21 MED ORDER — 0.9 % SODIUM CHLORIDE (POUR BTL) OPTIME
TOPICAL | Status: DC | PRN
  Administered 2023-08-21: 1000 mL

## 2023-08-21 MED ORDER — FENTANYL CITRATE PF 50 MCG/ML IJ SOSY: 25.0000 ug | PREFILLED_SYRINGE | INTRAMUSCULAR | Status: DC | PRN

## 2023-08-21 MED ORDER — ONDANSETRON HCL 4 MG PO TABS: 4.0000 mg | ORAL_TABLET | Freq: Four times a day (QID) | ORAL | Status: DC | PRN

## 2023-08-21 MED ORDER — OXYCODONE HCL 5 MG PO TABS
5.0000 mg | ORAL_TABLET | ORAL | Status: DC | PRN
  Administered 2023-08-21 – 2023-08-22 (×3): 10 mg via ORAL
  Filled 2023-08-21 (×3): qty 2

## 2023-08-21 MED ORDER — SODIUM CHLORIDE 0.9 % IV SOLN: INTRAVENOUS | Status: DC

## 2023-08-21 MED ORDER — HYDROCHLOROTHIAZIDE 25 MG PO TABS
25.0000 mg | ORAL_TABLET | Freq: Every day | ORAL | Status: DC
  Administered 2023-08-22: 25 mg via ORAL
  Filled 2023-08-21: qty 1

## 2023-08-21 MED ORDER — ACETAMINOPHEN 500 MG PO TABS: 1000.0000 mg | ORAL_TABLET | Freq: Three times a day (TID) | ORAL | Status: AC | PRN

## 2023-08-21 MED ORDER — PHENYLEPHRINE HCL-NACL 20-0.9 MG/250ML-% IV SOLN
INTRAVENOUS | Status: DC | PRN
  Administered 2023-08-21: 40 ug/min via INTRAVENOUS

## 2023-08-21 MED ORDER — BUPIVACAINE IN DEXTROSE 0.75-8.25 % IT SOLN
INTRATHECAL | Status: DC | PRN
  Administered 2023-08-21: 1.8 mL via INTRATHECAL

## 2023-08-21 MED ORDER — DEXAMETHASONE SODIUM PHOSPHATE 10 MG/ML IJ SOLN: 8.0000 mg | Freq: Once | INTRAMUSCULAR | Status: DC

## 2023-08-21 MED ORDER — METHOCARBAMOL 500 MG PO TABS: 500.0000 mg | ORAL_TABLET | Freq: Three times a day (TID) | ORAL | 0 refills | Status: AC | PRN

## 2023-08-21 MED ORDER — LACTATED RINGERS IV SOLN: INTRAVENOUS | Status: DC

## 2023-08-21 MED ORDER — ASPIRIN 81 MG PO CHEW
81.0000 mg | CHEWABLE_TABLET | Freq: Two times a day (BID) | ORAL | Status: DC
  Administered 2023-08-21 – 2023-08-22 (×2): 81 mg via ORAL
  Filled 2023-08-21 (×2): qty 1

## 2023-08-21 MED ORDER — FENTANYL CITRATE (PF) 100 MCG/2ML IJ SOLN
INTRAMUSCULAR | Status: AC
  Filled 2023-08-21: qty 2

## 2023-08-21 MED ORDER — ACETAMINOPHEN 500 MG PO TABS
1000.0000 mg | ORAL_TABLET | Freq: Four times a day (QID) | ORAL | Status: AC
  Administered 2023-08-21 – 2023-08-22 (×3): 1000 mg via ORAL
  Filled 2023-08-21 (×4): qty 2

## 2023-08-21 MED ORDER — DOCUSATE SODIUM 100 MG PO CAPS
100.0000 mg | ORAL_CAPSULE | Freq: Two times a day (BID) | ORAL | Status: DC
  Administered 2023-08-21 – 2023-08-22 (×3): 100 mg via ORAL
  Filled 2023-08-21 (×3): qty 1

## 2023-08-21 MED ORDER — MIDAZOLAM HCL 5 MG/5ML IJ SOLN
INTRAMUSCULAR | Status: DC | PRN
  Administered 2023-08-21: 2 mg via INTRAVENOUS

## 2023-08-21 MED ORDER — DIPHENHYDRAMINE HCL 12.5 MG/5ML PO ELIX: 12.5000 mg | ORAL_SOLUTION | ORAL | Status: DC | PRN

## 2023-08-21 MED ORDER — OXYCODONE HCL 5 MG PO TABS
ORAL_TABLET | ORAL | Status: AC
  Filled 2023-08-21: qty 1

## 2023-08-21 MED ORDER — POLYETHYLENE GLYCOL 3350 17 G PO PACK
17.0000 g | PACK | Freq: Every day | ORAL | Status: DC | PRN
  Administered 2023-08-22: 17 g via ORAL
  Filled 2023-08-21: qty 1

## 2023-08-21 MED ORDER — SODIUM CHLORIDE 0.9 % IR SOLN
Status: DC | PRN
  Administered 2023-08-21: 1000 mL

## 2023-08-21 MED ORDER — FUROSEMIDE 20 MG PO TABS
20.0000 mg | ORAL_TABLET | Freq: Every day | ORAL | Status: DC
  Administered 2023-08-22: 20 mg via ORAL
  Filled 2023-08-21: qty 1

## 2023-08-21 MED ORDER — ONDANSETRON HCL 4 MG/2ML IJ SOLN
INTRAMUSCULAR | Status: DC | PRN
  Administered 2023-08-21: 4 mg via INTRAVENOUS

## 2023-08-21 MED ORDER — METHOCARBAMOL 500 MG PO TABS
500.0000 mg | ORAL_TABLET | Freq: Four times a day (QID) | ORAL | Status: DC | PRN
Start: 2023-08-21 — End: 2023-08-22
  Administered 2023-08-21 – 2023-08-22 (×2): 500 mg via ORAL
  Filled 2023-08-21 (×2): qty 1

## 2023-08-21 MED ORDER — METHOCARBAMOL 1000 MG/10ML IJ SOLN: 500.0000 mg | Freq: Four times a day (QID) | INTRAMUSCULAR | Status: DC | PRN

## 2023-08-21 MED ORDER — ZOLPIDEM TARTRATE 5 MG PO TABS: 5.0000 mg | ORAL_TABLET | Freq: Every evening | ORAL | Status: DC | PRN

## 2023-08-21 MED ORDER — OXYCODONE HCL 5 MG PO TABS: 5.0000 mg | ORAL_TABLET | ORAL | 0 refills | Status: AC | PRN

## 2023-08-21 MED ORDER — SODIUM CHLORIDE (PF) 0.9 % IJ SOLN
INTRAMUSCULAR | Status: AC
  Filled 2023-08-21: qty 30

## 2023-08-21 MED ORDER — POVIDONE-IODINE 10 % EX SWAB: 2.0000 | Freq: Once | CUTANEOUS | Status: DC

## 2023-08-21 MED ORDER — CHLORHEXIDINE GLUCONATE 0.12 % MT SOLN
15.0000 mL | Freq: Once | OROMUCOSAL | Status: AC
Start: 2023-08-21 — End: 2023-08-21
  Administered 2023-08-21: 15 mL via OROMUCOSAL

## 2023-08-21 MED ORDER — ROPIVACAINE HCL 7.5 MG/ML IJ SOLN
INTRAMUSCULAR | Status: DC | PRN
  Administered 2023-08-21: 20 mL

## 2023-08-21 MED ORDER — PROPOFOL 500 MG/50ML IV EMUL
INTRAVENOUS | Status: DC | PRN
  Administered 2023-08-21: 100 ug/kg/min via INTRAVENOUS

## 2023-08-21 MED ORDER — ACETAMINOPHEN 325 MG PO TABS: 325.0000 mg | ORAL_TABLET | Freq: Four times a day (QID) | ORAL | Status: DC | PRN

## 2023-08-21 MED ORDER — TRANEXAMIC ACID-NACL 1000-0.7 MG/100ML-% IV SOLN
1000.0000 mg | INTRAVENOUS | Status: AC
  Administered 2023-08-21: 1000 mg via INTRAVENOUS
  Filled 2023-08-21: qty 100

## 2023-08-21 MED ORDER — ACETAMINOPHEN 500 MG PO TABS: 1000.0000 mg | ORAL_TABLET | Freq: Once | ORAL | Status: DC

## 2023-08-21 MED ORDER — DEXAMETHASONE SODIUM PHOSPHATE 10 MG/ML IJ SOLN
INTRAMUSCULAR | Status: DC | PRN
  Administered 2023-08-21: 10 mg via INTRAVENOUS

## 2023-08-21 MED ORDER — CEFAZOLIN SODIUM-DEXTROSE 2-4 GM/100ML-% IV SOLN
2.0000 g | INTRAVENOUS | Status: AC
  Administered 2023-08-21: 2 g via INTRAVENOUS
  Filled 2023-08-21: qty 100

## 2023-08-21 MED ORDER — BUPIVACAINE-EPINEPHRINE 0.25% -1:200000 IJ SOLN
INTRAMUSCULAR | Status: AC
  Filled 2023-08-21: qty 1

## 2023-08-21 MED ORDER — WATER FOR IRRIGATION, STERILE IR SOLN
Status: DC | PRN
  Administered 2023-08-21: 1000 mL

## 2023-08-21 MED ORDER — KETOROLAC TROMETHAMINE 15 MG/ML IJ SOLN
7.5000 mg | Freq: Four times a day (QID) | INTRAMUSCULAR | Status: AC
  Administered 2023-08-21 – 2023-08-22 (×4): 7.5 mg via INTRAVENOUS
  Filled 2023-08-21 (×4): qty 1

## 2023-08-21 SURGICAL SUPPLY — 54 items
BAG COUNTER SPONGE SURGICOUNT (BAG) IMPLANT
BLADE SAG 18X100X1.27 (BLADE) ×1 IMPLANT
BLADE SAW SAG 35X64 .89 (BLADE) ×1 IMPLANT
BNDG COHESIVE 3X5 TAN ST LF (GAUZE/BANDAGES/DRESSINGS) ×1 IMPLANT
BNDG ELASTIC 6X10 VLCR STRL LF (GAUZE/BANDAGES/DRESSINGS) ×1 IMPLANT
BONE CEMENT HUM AFFIXUS (Cement) ×2 IMPLANT
BOWL SMART MIX CTS (DISPOSABLE) ×1 IMPLANT
CEMENT BONE HUM AFFIXUS (Cement) IMPLANT
CHLORAPREP W/TINT 26 (MISCELLANEOUS) ×2 IMPLANT
COMP FEM CEMT PERSONA SZ9 LT (Knees) ×1 IMPLANT
COMPONENT FEM CEMT PRNSA SZ9LT (Knees) IMPLANT
COVER SURGICAL LIGHT HANDLE (MISCELLANEOUS) ×1 IMPLANT
CUFF TRNQT CYL 34X4.125X (TOURNIQUET CUFF) ×1 IMPLANT
DERMABOND ADVANCED .7 DNX12 (GAUZE/BANDAGES/DRESSINGS) ×1 IMPLANT
DRAPE INCISE IOBAN 85X60 (DRAPES) ×1 IMPLANT
DRAPE SHEET LG 3/4 BI-LAMINATE (DRAPES) ×1 IMPLANT
DRAPE U-SHAPE 47X51 STRL (DRAPES) ×1 IMPLANT
DRSG AQUACEL AG ADV 3.5X10 (GAUZE/BANDAGES/DRESSINGS) ×1 IMPLANT
DRSG AQUACEL AG ADV 3.5X14 (GAUZE/BANDAGES/DRESSINGS) IMPLANT
ELECT REM PT RETURN 15FT ADLT (MISCELLANEOUS) ×1 IMPLANT
GAUZE SPONGE 4X4 12PLY STRL (GAUZE/BANDAGES/DRESSINGS) ×1 IMPLANT
GLOVE BIO SURGEON STRL SZ 6.5 (GLOVE) ×2 IMPLANT
GLOVE BIOGEL PI IND STRL 6.5 (GLOVE) ×1 IMPLANT
GLOVE BIOGEL PI IND STRL 8 (GLOVE) ×1 IMPLANT
GLOVE SURG ORTHO 8.0 STRL STRW (GLOVE) ×2 IMPLANT
GOWN STRL REUS W/ TWL XL LVL3 (GOWN DISPOSABLE) ×2 IMPLANT
HOLDER FOLEY CATH W/STRAP (MISCELLANEOUS) ×1 IMPLANT
HOOD PEEL AWAY T7 (MISCELLANEOUS) ×3 IMPLANT
INSERT ARTISURF SZ 8-11 EF 13 (Insert) IMPLANT
KIT TURNOVER KIT A (KITS) IMPLANT
MANIFOLD NEPTUNE II (INSTRUMENTS) ×1 IMPLANT
MARKER SKIN DUAL TIP RULER LAB (MISCELLANEOUS) ×1 IMPLANT
NS IRRIG 1000ML POUR BTL (IV SOLUTION) ×1 IMPLANT
PACK TOTAL KNEE CUSTOM (KITS) ×1 IMPLANT
PIN DRILL HDLS TROCAR 75 4PK (PIN) IMPLANT
SCREW HEADED 33MM KNEE (MISCELLANEOUS) IMPLANT
SET HNDPC FAN SPRY TIP SCT (DISPOSABLE) ×1 IMPLANT
SOLUTION IRRIG SURGIPHOR (IV SOLUTION) IMPLANT
SPIKE FLUID TRANSFER (MISCELLANEOUS) ×1 IMPLANT
STEM POLY PAT PLY 32M KNEE (Knees) IMPLANT
STEM TIBIA 5 DEG SZ E L KNEE (Knees) IMPLANT
STRIP CLOSURE SKIN 1/2X4 (GAUZE/BANDAGES/DRESSINGS) ×1 IMPLANT
SUT MNCRL AB 3-0 PS2 18 (SUTURE) ×1 IMPLANT
SUT STRATAFIX 0 PDS 27 VIOLET (SUTURE) ×1
SUT STRATAFIX 14 PDO 48 VLT (SUTURE) ×1 IMPLANT
SUT STRATAFIX PDO 1 14 VIOLET (SUTURE) ×1
SUT VIC AB 2-0 CT2 27 (SUTURE) ×2 IMPLANT
SUTURE STRATFX 0 PDS 27 VIOLET (SUTURE) ×1 IMPLANT
SYR 50ML LL SCALE MARK (SYRINGE) ×1 IMPLANT
TIBIA STEM 5 DEG SZ E L KNEE (Knees) ×1 IMPLANT
TRAY FOLEY MTR SLVR 14FR STAT (SET/KITS/TRAYS/PACK) IMPLANT
TUBE SUCTION HIGH CAP CLEAR NV (SUCTIONS) ×1 IMPLANT
UNDERPAD 30X36 HEAVY ABSORB (UNDERPADS AND DIAPERS) ×1 IMPLANT
WRAP KNEE MAXI GEL POST OP (GAUZE/BANDAGES/DRESSINGS) IMPLANT

## 2023-08-21 NOTE — Transfer of Care (Signed)
Immediate Anesthesia Transfer of Care Note  Patient: Jodi Duran  Procedure(s) Performed: TOTAL KNEE ARTHROPLASTY (Left: Knee)  Patient Location: PACU  Anesthesia Type:Spinal  Level of Consciousness: awake, alert , and oriented  Airway & Oxygen Therapy: Patient Spontanous Breathing and Patient connected to face mask oxygen  Post-op Assessment: Report given to RN and Post -op Vital signs reviewed and stable  Post vital signs: Reviewed and stable  Last Vitals:  Vitals Value Taken Time  BP 109/91 08/21/23 1028  Temp    Pulse 76 08/21/23 1030  Resp 19 08/21/23 1030  SpO2 99 % 08/21/23 1030  Vitals shown include unfiled device data.  Last Pain:  Vitals:   08/21/23 0645  TempSrc:   PainSc: 0-No pain         Complications: No notable events documented.

## 2023-08-21 NOTE — Evaluation (Signed)
Physical Therapy Evaluation Patient Details Name: Jodi Duran MRN: [id number] DOB: 04-18-1956 Today's Date: 08/21/2023  History of Present Illness  67 yo female s/p L TKA on 08/21/23. PMH:RA, obesity, HTN  Clinical Impression  Pt is s/p TKA resulting in the deficits listed below (see PT Problem List).  PT amb 20' with RW and min-CGA assist. Distance limited by dizziness. Pt is very motivated. Anticipate steady progress in acute setting  Pt will benefit from acute skilled PT to increase their independence and safety with mobility to allow discharge.          If plan is discharge home, recommend the following: A little help with walking and/or transfers;A little help with bathing/dressing/bathroom;Assistance with cooking/housework;Assist for transportation;Help with stairs or ramp for entrance   Can travel by private vehicle        Equipment Recommendations    Recommendations for Other Services       Functional Status Assessment Patient has had a recent decline in their functional status and demonstrates the ability to make significant improvements in function in a reasonable and predictable amount of time.     Precautions / Restrictions Precautions Precautions: Fall Restrictions Weight Bearing Restrictions: No Other Position/Activity Restrictions: WBAT      Mobility  Bed Mobility Overal bed mobility: Needs Assistance Bed Mobility: Supine to Sit     Supine to sit: Supervision     General bed mobility comments: for safety    Transfers Overall transfer level: Needs assistance Equipment used: Rolling walker (2 wheels) Transfers: Sit to/from Stand Sit to Stand: Min assist           General transfer comment: cues for hand placement, light assist to rise    Ambulation/Gait Ambulation/Gait assistance: Contact guard assist, Min assist Gait Distance (Feet): 20 Feet Assistive device: Rolling walker (2 wheels) Gait Pattern/deviations: Step-to pattern,  Decreased weight shift to right       General Gait Details: cues for sequence and RW position  Stairs            Wheelchair Mobility     Tilt Bed    Modified Rankin (Stroke Patients Only)       Balance                                             Pertinent Vitals/Pain Pain Assessment Pain Assessment: 0-10 Pain Score: 4  Pain Location: left knee Pain Descriptors / Indicators: Sore Pain Intervention(s): Limited activity within patient's tolerance, Monitored during session, Premedicated before session, Repositioned, Ice applied    Home Living Family/patient expects to be discharged to:: Private residence Living Arrangements: Other relatives Available Help at Discharge: Family;Available 24 hours/day Type of Home: House Home Access: Stairs to enter   [employer] of Steps: 1 very small step/threshold   Home Layout: One level        Prior Function Prior Level of Function : Independent/Modified Independent                     Extremity/Trunk Assessment   Upper Extremity Assessment Upper Extremity Assessment: Overall WFL for tasks assessed    Lower Extremity Assessment Lower Extremity Assessment: LLE deficits/detail LLE Deficits / Details: ankle WFL, knee extension and hip flexion 3/5       Communication      Cognition Arousal: Alert Behavior During Therapy: [facility] for tasks assessed/performed  Overall Cognitive Status: Within Functional Limits for tasks assessed                                          General Comments      Exercises Total Joint Exercises Ankle Circles/Pumps: AROM, Both, 10 reps Quad Sets: 5 reps, Both, AROM   Assessment/Plan    PT Assessment Patient needs continued PT services  PT Problem List Decreased strength;Decreased range of motion;Decreased activity tolerance;Decreased balance;Decreased mobility;Decreased knowledge of precautions;Decreased knowledge of use of DME        PT Treatment Interventions DME instruction;Gait training;Functional mobility training;Therapeutic activities;Therapeutic exercise;Patient/family education    PT Goals (Current goals can be found in the Care Plan section)  Acute Rehab PT Goals Patient Stated Goal: "get my swag back" PT Goal Formulation: With patient Time For Goal Achievement: 08/28/23 Potential to Achieve Goals: Good    Frequency 7X/week     Co-evaluation               AM-PAC PT "6 Clicks" Mobility  Outcome Measure Help needed turning from your back to your side while in a flat bed without using bedrails?: A Little Help needed moving from lying on your back to sitting on the side of a flat bed without using bedrails?: A Little Help needed moving to and from a bed to a chair (including a wheelchair)?: A Little Help needed standing up from a chair using your arms (e.g., wheelchair or bedside chair)?: A Little Help needed to walk in hospital room?: A Little Help needed climbing 3-5 steps with a railing? : A Lot 6 Click Score: 17    End of Session Equipment Utilized During Treatment: Gait belt Activity Tolerance: Patient tolerated treatment well Patient left: with call bell/phone within reach;in chair;with chair alarm set Nurse Communication: Mobility status PT Visit Diagnosis: Other abnormalities of gait and mobility (R26.89);Difficulty in walking, not elsewhere classified (R26.2)    Time: 1610-9604 PT Time Calculation (min) (ACUTE ONLY): 19 min   Charges:   PT Evaluation $PT Eval Low Complexity: 1 Low   PT General Charges $$ ACUTE PT VISIT: 1 Visit         Yandiel Bergum, PT  Acute Rehab Dept ([facility]) [phone]  08/21/2023   [facility] 08/21/2023, 5:02 PM

## 2023-08-21 NOTE — Anesthesia Postprocedure Evaluation (Signed)
Anesthesia Post Note  Patient: Jodi Duran  Procedure(s) Performed: TOTAL KNEE ARTHROPLASTY (Left: Knee)     Patient location during evaluation: PACU Anesthesia Type: Spinal Level of consciousness: awake and alert Pain management: pain level controlled Vital Signs Assessment: post-procedure vital signs reviewed and stable Respiratory status: spontaneous breathing and respiratory function stable Cardiovascular status: blood pressure returned to baseline and stable Postop Assessment: spinal receding and no apparent nausea or vomiting Anesthetic complications: no   No notable events documented.  Last Vitals:  Vitals:   08/21/23 1115 08/21/23 1130  BP: (!) 151/86 (!) 137/124  Pulse: 65 64  Resp: 17 20  Temp: (!) 36.4 C   SpO2: 96% 96%    Last Pain:  Vitals:   08/21/23 1130  TempSrc:   PainSc: 0-No pain    LLE Motor Response: Purposeful movement (08/21/23 1130) LLE Sensation: Decreased (08/21/23 1130) RLE Motor Response: Purposeful movement (08/21/23 1130) RLE Sensation: Decreased (08/21/23 1130) L Sensory Level: S1-Sole of foot, small toes (08/21/23 1130) R Sensory Level: S1-Sole of foot, small toes (08/21/23 1130)  Beryle Lathe

## 2023-08-21 NOTE — Discharge Instructions (Signed)

## 2023-08-21 NOTE — Op Note (Signed)
DATE OF SURGERY:  08/21/2023 TIME: 9:50 AM  PATIENT NAME:  Jodi Duran   AGE: 67 y.o.    PRE-OPERATIVE DIAGNOSIS:  Severe left knee rheumatoid arthritis, preoperative range of motion 15 to 80 degrees  POST-OPERATIVE DIAGNOSIS:  Same  PROCEDURE:  Cemented Left Total Knee Arthroplasty  SURGEON:  Valentine Kuechle A Ewald Beg, MD   ASSISTANT:  Kathie Dike, PA-C, present and scrubbed throughout the case, critical for assistance with exposure, retraction, instrumentation, and closure.   OPERATIVE IMPLANTS:  Cemented Zimmer persona left 9 standard femur, E left tibial baseplate, 13 mm MC poly, 32 mm cemented all poly patella Implant Name Type Inv. Item Serial No. Manufacturer Lot No. LRB No. Used Action  BONE CEMENT HUM AFFIXUS - [id number] Cement BONE CEMENT HUM AFFIXUS  [employer](ORTH,TRAU,BIO,SG) [id number] Left 2 Implanted  TIBIA STEM 5 DEG SZ E L KNEE - [id number] Knees TIBIA STEM 5 DEG SZ E L KNEE  [employer](ORTH,TRAU,BIO,SG) [id number] Left 1 Implanted  COMP FEM CEMT PERSONA SZ9 LT - [id number] Knees COMP FEM CEMT PERSONA SZ9 LT  [employer](ORTH,TRAU,BIO,SG) [id number] Left 1 Implanted  STEM POLY PAT PLY 47M KNEE - [id number] Knees STEM POLY PAT PLY 47M KNEE  [employer](ORTH,TRAU,BIO,SG) [id number] Left 1 Implanted  INSERT ARTISURF SZ 8-11 EF 13 - [id number] Insert INSERT ARTISURF SZ 8-11 EF 13  [employer](ORTH,TRAU,BIO,SG) [id number] Left 1 Implanted      PREOPERATIVE INDICATIONS:  Jodi Duran is a 67 y.o. year old female with end stage bone on bone degenerative arthritis of the knee who failed conservative treatment, including injections, antiinflammatories, activity modification, and assistive devices, and had significant impairment of their activities of daily living, and elected for Total Knee Arthroplasty.   The risks, benefits, and alternatives were discussed at length including but not limited to the risks of infection, bleeding, nerve injury, stiffness, blood  clots, the need for revision surgery, cardiopulmonary complications, among others, and they were willing to proceed.  OPERATIVE FINDINGS AND UNIQUE ASPECTS OF THE CASE: Severe inflammatory arthritis with significant cartilage wear Tricold mental bone-on-bone, severe synovitis and adhesions with preoperative range of motion only 15 - 80 degrees  ESTIMATED BLOOD LOSS: 50c  OPERATIVE DESCRIPTION:   Once adequate anesthesia was induced, preoperative antibiotics, 2 gm of ancef,1 gm of Tranexamic Acid, and 8 mg of Decadron administered, the patient was positioned supine with a left thigh tourniquet placed.  The left lower extremity was prepped and draped in sterile fashion.  A time-  out was performed identifying the patient, planned procedure, and the appropriate extremity.     The leg was  exsanguinated, tourniquet elevated to 250 mmHg.  A midline incision was  made followed by median parapatellar arthrotomy. Anterior horn of the medial meniscus was released and resected. A medial release was performed, the infrapatellar fat pad was resected with care taken to protect the patellar tendon. The suprapatellar fat was removed to exposed the distal anterior femur. The anterior horn of the lateral meniscus and ACL were released.    Following initial  exposure, I first started with the femur  The femoral  canal was opened with a drill, canal was suctioned to try to prevent fat emboli.  An  intramedullary rod was passed set at 5 degrees valgus, 10 mm. The distal femur was resected.  Following this resection, the tibia was  subluxated anteriorly.  Using the extramedullary guide, 10mm of bone was resected off the proximal lateral tibia.  The extension gap was very tight so elected  to resect additional 2 mm off the proximal tibia.  We confirmed the gap would be  stable medially and laterally with a size 10mm spacer block as well as confirmed that the tibial cut was perpendicular in the coronal plane, checking with an  alignment rod.    Once this was done, the posterior femoral referencing femoral sizer was placed under to the posterior condyles with 5 degrees of external rotational which was parallel to the transepicondylar axis and perpendicular to [employer]. The femur was sized to be a size 9 in the anterior-  posterior dimension. The  anterior, posterior, and  chamfer cuts were made without difficulty nor   notching making certain that I was along the anterior cortex to help  with flexion gap stability. Next a laminar spreader was placed with the knee in flexion and the medial lateral menisci were resected.  5 cc of the Exparel mixture was injected in the medial side of the back of the knee and 3 cc in the lateral side.  1/2 inch curved osteotome was used to resect posterior osteophyte that was then removed with a pituitary rongeur.       At this point, the tibia was sized to be a size E.  The size E tray was  then pinned in position. Trial reduction was now carried with a 9 femur, E tibia, a 12 mm MC insert.  The knee had full extension and was stable to varus valgus stress in extension.  The knee was slightly tight in flexion and the PCL was released.   Attention was next directed to the patella.  Precut  measurement was noted to be 22 mm.  I resected down to 13mm and used a  32mm patellar button to restore patellar height as well as cover the cut surface.     The patella lug holes were drilled and a 32mm patella poly trial was placed.    The knee was brought to full extension with good flexion stability with the patella tracking through the trochlea without application of pressure.     Next the femoral component was again assessed and determined to be seated and appropriately lateralized.  The femoral lug holes were drilled.  The femoral component was then removed. Tibial component was again assessed and felt to be seated and appropriately rotated with the medial third of the tubercle. The tibia was  then drilled, and keel punched.     Final components were  opened and cement was mixed.      Final implants were then  cemented onto cleaned and dried cut surfaces of bone with the knee brought to extension with a 12 mm MC poly.  The knee was irrigated with sterile Betadine diluted in saline as well as pulse lavage normal saline.  The synovial lining was  then injected a dilute Exparel with 30cc of 0.25% marcaine with epinephrine.         Once the cement had fully cured, excess cement was removed throughout the knee.  I confirmed that I was satisfied with the range of motion and stability, and the final 13mm MC poly insert was chosen.  It was placed into the knee.         The tourniquet had been let down.  No significant hemostasis was required.  The medial parapatellar arthrotomy was then reapproximated using #1 Stratafix sutures with the knee  in flexion.  The remaining wound was closed with 0 stratafix, 2-0 Vicryl, and running 3-0 Monocryl.  The knee was cleaned, dried, dressed sterilely using Dermabond and   Aquacel dressing.  The patient was then brought to recovery room in stable condition, tolerating the procedure  well. There were no complications.   Post op recs: WB: WBAT Abx: ancef Imaging: PACU xrays DVT prophylaxis: Aspirin 81mg  BID x4 weeks  Follow up: 2 weeks after surgery for a wound check with Dr. Blanchie Dessert at [facility].  Address: [address], [place], [place] [address]  Office Phone: [phone]  Weber Cooks, MD Orthopaedic Surgery

## 2023-08-21 NOTE — Interval H&P Note (Signed)
The patient has been re-examined, and the chart reviewed, and there have been no interval changes to the documented history and physical.    Plan for L TKA for L knee OA  The operative side was examined and the patient was confirmed to have sensation to DPN, SPN, TN intact, Motor EHL, ext, flex 5/5, and DP 2+, PT 2+, No significant edema.   The risks, benefits, and alternatives have been discussed at length with patient, and the patient is willing to proceed.  Left knee marked. Consent has been signed.  

## 2023-08-21 NOTE — Anesthesia Procedure Notes (Signed)
Anesthesia Regional Block: Adductor canal block   Pre-Anesthetic Checklist: , timeout performed,  Correct Patient, Correct Site, Correct Laterality,  Correct Procedure, Correct Position, site marked,  Risks and benefits discussed,  Surgical consent,  Pre-op evaluation,  At surgeon's request and post-op pain management  Laterality: Left  Prep: chloraprep       Needles:  Injection technique: Single-shot  Needle Type: Echogenic Needle     Needle Length: 10cm  Needle Gauge: 21     Additional Needles:   Narrative:  Start time: 08/21/2023 7:07 AM End time: 08/21/2023 7:10 AM Injection made incrementally with aspirations every 5 mL.  Performed by: Personally  Anesthesiologist: Beryle Lathe, MD  Additional Notes: No pain on injection. No increased resistance to injection. Injection made in 5cc increments. Good needle visualization. Patient tolerated the procedure well.

## 2023-08-21 NOTE — Progress Notes (Signed)
Orthopedic Tech Progress Note Patient Details:  Jodi Duran 1956/09/07 [id number]  Dropped of bone foam at bedside with PACU RN. Ortho Devices Type of Ortho Device: Bone foam zero knee Ortho Device/Splint Interventions: Floria Raveling 08/21/2023, 10:55 AM

## 2023-08-21 NOTE — Plan of Care (Signed)
  Problem: Coping: Goal: Level of anxiety will decrease Outcome: Progressing   Problem: Pain Management: Goal: General experience of comfort will improve Outcome: Progressing

## 2023-08-21 NOTE — Anesthesia Procedure Notes (Signed)
Spinal  Patient location during procedure: OR Start time: 08/21/2023 7:47 AM End time: 08/21/2023 7:55 AM Reason for block: surgical anesthesia Staffing Performed: anesthesiologist  Anesthesiologist: Beryle Lathe, MD Performed by: Beryle Lathe, MD Authorized by: Beryle Lathe, MD   Preanesthetic Checklist Completed: patient identified, IV checked, risks and benefits discussed, surgical consent, monitors and equipment checked, pre-op evaluation and timeout performed Spinal Block Patient position: sitting Prep: DuraPrep Patient monitoring: heart rate, cardiac monitor, continuous pulse ox and blood pressure Approach: midline Location: L2-3 Injection technique: single-shot Needle Needle type: Quincke  Needle gauge: 22 G Needle length: 12.7 cm Additional Notes Consent was obtained prior to the procedure with all questions answered and concerns addressed. Risks including, but not limited to, bleeding, infection, nerve damage, paralysis, failed block, inadequate analgesia, allergic reaction, high spinal, itching, and headache were discussed and the patient wished to proceed. Functioning IV was confirmed and monitors were applied. Sterile prep and drape, including hand hygiene, mask, and sterile gloves were used. The patient was positioned and the spine was prepped. The skin was anesthetized with lidocaine. Free flow of clear CSF was obtained prior to injecting local anesthetic into the CSF. The spinal needle aspirated freely following injection. The needle was carefully withdrawn. The patient tolerated the procedure well.   Leslye Peer, MD

## 2023-08-21 NOTE — Plan of Care (Signed)
  Problem: Pain Management: Goal: General experience of comfort will improve Outcome: Progressing   Problem: Safety: Goal: Ability to remain free from injury will improve Outcome: Progressing   Problem: Activity: Goal: Ability to avoid complications of mobility impairment will improve Outcome: Progressing   Problem: Clinical Measurements: Goal: Postoperative complications will be avoided or minimized Outcome: Progressing

## 2023-08-22 DIAGNOSIS — M1712 Unilateral primary osteoarthritis, left knee: Secondary | ICD-10-CM | POA: Diagnosis not present

## 2023-08-22 DIAGNOSIS — Z8616 Personal history of COVID-19: Secondary | ICD-10-CM | POA: Diagnosis not present

## 2023-08-22 DIAGNOSIS — I10 Essential (primary) hypertension: Secondary | ICD-10-CM | POA: Diagnosis not present

## 2023-08-22 DIAGNOSIS — Z87891 Personal history of nicotine dependence: Secondary | ICD-10-CM | POA: Diagnosis not present

## 2023-08-22 DIAGNOSIS — Z79899 Other long term (current) drug therapy: Secondary | ICD-10-CM | POA: Diagnosis not present

## 2023-08-22 LAB — CBC
HCT: 26.4 % — ABNORMAL LOW (ref 36.0–46.0)
Hemoglobin: 8.8 g/dL — ABNORMAL LOW (ref 12.0–15.0)
MCH: 30.2 pg (ref 26.0–34.0)
MCHC: 33.3 g/dL (ref 30.0–36.0)
MCV: 90.7 fL (ref 80.0–100.0)
Platelets: 205 10*3/uL (ref 150–400)
RBC: 2.91 MIL/uL — ABNORMAL LOW (ref 3.87–5.11)
RDW: 13.2 % (ref 11.5–15.5)
WBC: 7.2 10*3/uL (ref 4.0–10.5)
nRBC: 0 % (ref 0.0–0.2)

## 2023-08-22 LAB — BASIC METABOLIC PANEL
Anion gap: 9 (ref 5–15)
BUN: 21 mg/dL (ref 8–23)
CO2: 26 mmol/L (ref 22–32)
Calcium: 9 mg/dL (ref 8.9–10.3)
Chloride: 102 mmol/L (ref 98–111)
Creatinine, Ser: 0.76 mg/dL (ref 0.44–1.00)
GFR, Estimated: >60 mL/min (ref >60)
Glucose, Bld: 160 mg/dL — ABNORMAL HIGH (ref 70–99)
Potassium: 3.1 mmol/L — ABNORMAL LOW (ref 3.5–5.1)
Sodium: 137 mmol/L (ref 135–145)

## 2023-08-22 NOTE — Progress Notes (Signed)
Physical Therapy Treatment Patient Details Name: Jodi Duran MRN: [id number] DOB: 27-Dec-1955 Today's Date: 08/22/2023   History of Present Illness 67 yo female s/p L TKA on 08/21/23. PMH:RA, obesity, HTN    PT Comments  Excellent progress this session, meeting PT goals. Reviewed verbally reviewed maneuvering threshold step and car transfers in addition to mobility as below. Ready to d/c from PT standpoint with friends assisting prn   If plan is discharge home, recommend the following: A little help with walking and/or transfers;A little help with bathing/dressing/bathroom;Assistance with cooking/housework;Assist for transportation;Help with stairs or ramp for entrance   Can travel by private vehicle        Equipment Recommendations  Rolling walker (2 wheels)    Recommendations for Other Services       Precautions / Restrictions Precautions Precautions: Fall;Knee Restrictions Weight Bearing Restrictions: No Other Position/Activity Restrictions: WBAT     Mobility  Bed Mobility Overal bed mobility: Needs Assistance Bed Mobility: Supine to Sit     Supine to sit: Supervision     General bed mobility comments: for safety    Transfers Overall transfer level: Needs assistance Equipment used: Rolling walker (2 wheels) Transfers: Sit to/from Stand Sit to Stand: Contact guard assist, Supervision           General transfer comment: cues for hand placement, light assist to rise    Ambulation/Gait Ambulation/Gait assistance: Supervision, Contact guard assist Gait Distance (Feet): 120 Feet (+15') Assistive device: Rolling walker (2 wheels) Gait Pattern/deviations: Step-to pattern, Step-through pattern, Decreased dorsiflexion - left, Decreased dorsiflexion - right, Decreased weight shift to left       General Gait Details: cues for sequence and RW position. progression step through pattern workign on heel strike on left with initial contact   Stairs              Wheelchair Mobility     Tilt Bed    Modified Rankin (Stroke Patients Only)       Balance                                            Cognition Arousal: Alert Behavior During Therapy: WFL for tasks assessed/performed Overall Cognitive Status: Within Functional Limits for tasks assessed                                          Exercises Total Joint Exercises Ankle Circles/Pumps: AROM, Both, 10 reps Quad Sets: 5 reps, Both, AROM Heel Slides: AAROM, Left, 10 reps Straight Leg Raises: AAROM, Left, 5 reps    General Comments        Pertinent Vitals/Pain Pain Assessment Pain Assessment: 0-10 Pain Score: 6  Pain Location: left knee Pain Descriptors / Indicators: Sore Pain Intervention(s): Limited activity within patient's tolerance, Monitored during session, Premedicated before session, Repositioned    Home Living                          Prior Function            PT Goals (current goals can now be found in the care plan section) Acute Rehab PT Goals Patient Stated Goal: "get my swag back" PT Goal Formulation: With patient Time For Goal Achievement: 08/28/23 Potential to Achieve  Goals: Good Progress towards PT goals: Progressing toward goals    Frequency    7X/week      PT Plan      Co-evaluation              AM-PAC PT "6 Clicks" Mobility   Outcome Measure  Help needed turning from your back to your side while in a flat bed without using bedrails?: A Little Help needed moving from lying on your back to sitting on the side of a flat bed without using bedrails?: None Help needed moving to and from a bed to a chair (including a wheelchair)?: A Little Help needed standing up from a chair using your arms (e.g., wheelchair or bedside chair)?: A Little Help needed to walk in hospital room?: A Little Help needed climbing 3-5 steps with a railing? : A Little 6 Click Score: 19    End of Session  Equipment Utilized During Treatment: Gait belt Activity Tolerance: Patient tolerated treatment well Patient left: with call bell/phone within reach;in chair;with chair alarm set   PT Visit Diagnosis: Other abnormalities of gait and mobility (R26.89);Difficulty in walking, not elsewhere classified (R26.2)     Time: 9371-6967 PT Time Calculation (min) (ACUTE ONLY): 32 min  Charges:    $Gait Training: 23-37 mins PT General Charges $$ ACUTE PT VISIT: 1 Visit                     Annetta Deiss, PT  Acute Rehab Dept [facility]) [phone]  08/22/2023    [facility] 08/22/2023, 10:33 AM

## 2023-08-22 NOTE — Progress Notes (Signed)
    1 Day Post-Op Procedure(s) (LRB): TOTAL KNEE ARTHROPLASTY (Left)  Subjective:  Patient reports pain as mild.  Slept okay.  Ambulated 20 ft with PT yesterday.  Eager for PT today.  Hopeful for DC home today.  No overnight events or other complaints.  Objective:   VITALS:   Vitals:   08/21/23 1427 08/21/23 2221 08/22/23 0104 08/22/23 0522  BP: (!) 149/81 131/74 136/76 136/86  Pulse: 77 85 90 79  Resp: 18 16 17 16   Temp: 98 F (36.7 C) 98.4 F (36.9 C) 98.7 F (37.1 C) 98.3 F (36.8 C)  TempSrc:  Oral Oral Oral  SpO2: 100% 94% 93% 95%  Weight:      Height:        Neurologically intact Sensation intact distally Intact pulses distally Dorsiflexion/Plantar flexion intact Incision: dressing C/D/I Compartment soft Wiggles toes appropriately   Lab Results  Component Value Date   WBC 7.2 08/22/2023   HGB 8.8 (L) 08/22/2023   HCT 26.4 (L) 08/22/2023   MCV 90.7 08/22/2023   PLT 205 08/22/2023   BMET    Component Value Date/Time   NA 137 08/22/2023 0327   K 3.1 (L) 08/22/2023 0327   CL 102 08/22/2023 0327   CO2 26 08/22/2023 0327   GLUCOSE 160 (H) 08/22/2023 0327   BUN 21 08/22/2023 0327   CREATININE 0.76 08/22/2023 0327   CALCIUM 9.0 08/22/2023 0327   GFRNONAA >60 08/22/2023 0327      Xray: Total knee arthroplasty components in good position without adverse features of left knee  Assessment/Plan: 1 Day Post-Op   Principal Problem:   Primary osteoarthritis of left knee   Post op recs: WB: WBAT Abx: ancef Imaging: PACU xrays DVT prophylaxis: Aspirin 81mg  BID x4 weeks  Follow up: 2 weeks after surgery for a wound check with Dr. Blanchie Dessert at [facility].  Address: [address], [place], [place] [address]  Office Phone: [phone]    Cecil Cobbs 08/22/2023, 7:54 AM   Contact information:   Weekdays 7am-5pm epic message Dr. Blanchie Dessert, or call office for patient follow up: [phone] After hours and  holidays please check [email or web address] for group call information for [facility]

## 2023-08-22 NOTE — Discharge Summary (Signed)
Physician Discharge Summary  Patient ID: Jodi Duran MRN: [id number] DOB/AGE: 1955-10-08 67 y.o.  Admit date: 08/21/2023 Discharge date: 08/22/2023  Admission Diagnoses:  Primary osteoarthritis of left knee  Discharge Diagnoses:  Principal Problem:   Primary osteoarthritis of left knee   Past Medical History:  Diagnosis Date   Arthritis    Hypertension     Surgeries: Procedure(s): TOTAL KNEE ARTHROPLASTY on 08/21/2023   Consultants (if any):   Discharged Condition: Improved  Hospital Course: Jodi Duran is an 67 y.o. female who was admitted 08/21/2023 with a diagnosis of Primary osteoarthritis of left knee and went to the operating room on 08/21/2023 and underwent the above named procedures.    She was given perioperative antibiotics:  Anti-infectives (From admission, onward)    Start     Dose/Rate Route Frequency Ordered Stop   08/21/23 1400  ceFAZolin (ANCEF) IVPB 2g/100 mL premix        2 g 200 mL/hr over 30 Minutes Intravenous Every 6 hours 08/21/23 1205 08/21/23 2053   08/21/23 0600  ceFAZolin (ANCEF) IVPB 2g/100 mL premix        2 g 200 mL/hr over 30 Minutes Intravenous On call to O.R. 08/21/23 4098 08/21/23 0751     .  She was given sequential compression devices, early ambulation, and Aspirin for DVT prophylaxis.  She benefited maximally from the hospital stay and there were no complications.    Recent vital signs:  Vitals:   08/22/23 0104 08/22/23 0522  BP: 136/76 136/86  Pulse: 90 79  Resp: 17 16  Temp: 98.7 F (37.1 C) 98.3 F (36.8 C)  SpO2: 93% 95%    Recent laboratory studies:  Lab Results  Component Value Date   HGB 8.8 (L) 08/22/2023   HGB 11.4 (L) 08/09/2023   Lab Results  Component Value Date   WBC 7.2 08/22/2023   PLT 205 08/22/2023   No results found for: "INR" Lab Results  Component Value Date   NA 137 08/22/2023   K 3.1 (L) 08/22/2023   CL 102 08/22/2023   CO2 26 08/22/2023   BUN 21 08/22/2023   CREATININE  0.76 08/22/2023   GLUCOSE 160 (H) 08/22/2023    Discharge Medications:   Allergies as of 08/22/2023       Reactions   Latex Hives   Meloxicam Itching, Rash   Methotrexate Itching, Rash        Medication List     STOP taking these medications    acetaminophen 650 MG CR tablet Commonly known as: TYLENOL Replaced by: acetaminophen 500 MG tablet   ibuprofen 800 MG tablet Commonly known as: ADVIL       TAKE these medications    acetaminophen 500 MG tablet Commonly known as: TYLENOL Take 2 tablets (1,000 mg total) by mouth every 8 (eight) hours as needed. Replaces: acetaminophen 650 MG CR tablet   aspirin EC 81 MG tablet Take 1 tablet (81 mg total) by mouth 2 (two) times daily for 28 days. Swallow whole.   BOWEL SUPPORT PO Take 1 capsule by mouth 2 (two) times daily.   celecoxib 100 MG capsule Commonly known as: CeleBREX Take 1 capsule (100 mg total) by mouth 2 (two) times daily for 14 days.   furosemide 20 MG tablet Commonly known as: LASIX Take 20 mg by mouth daily.   hydrochlorothiazide 25 MG tablet Commonly known as: HYDRODIURIL Take 25 mg by mouth daily.   KRILL OIL PO Take 2,000 mg by mouth daily.  methocarbamol 500 MG tablet Commonly known as: ROBAXIN Take 1 tablet (500 mg total) by mouth every 8 (eight) hours as needed for up to 10 days for muscle spasms.   omeprazole 40 MG capsule Commonly known as: PRILOSEC Take 1 capsule (40 mg total) by mouth daily for 21 days.   ondansetron 4 MG tablet Commonly known as: Zofran Take 1 tablet (4 mg total) by mouth every 8 (eight) hours as needed for up to 14 days for nausea or vomiting.   oxyCODONE 5 MG immediate release tablet Commonly known as: Roxicodone Take 1 tablet (5 mg total) by mouth every 4 (four) hours as needed for up to 7 days for severe pain (pain score 7-10) or moderate pain (pain score 4-6).   polyethylene glycol 17 g packet Commonly known as: MiraLax Take 17 g by mouth daily.    potassium chloride 10 MEQ tablet Commonly known as: KLOR-CON Take 10 mEq by mouth daily.   rosuvastatin 10 MG tablet Commonly known as: CRESTOR Take 1 tablet (10 mg total) by mouth daily.        Diagnostic Studies: DG Knee Left Port  Result Date: 08/21/2023 CLINICAL DATA:  Postoperative state. EXAM: PORTABLE LEFT KNEE - 1-2 VIEW COMPARISON:  Left knee radiographs 02/01/2019 FINDINGS: Interval total left knee arthroplasty. No perihardware lucency is seen to indicate hardware failure or loosening. Expected postoperative changes including small joint effusion, intra-articular air, and subcutaneous air. No acute fracture or dislocation. IMPRESSION: Interval total left knee arthroplasty without evidence of hardware failure. Electronically Signed   By: Neita Garnet M.D.   On: 08/21/2023 13:44    Disposition: Discharge disposition: 01-Home or Self Care       Discharge Instructions     Call MD / Call 911   Complete by: As directed    If you experience chest pain or shortness of breath, CALL 911 and be transported to the hospital emergency room.  If you develope a fever above 101 F, pus (white drainage) or increased drainage or redness at the wound, or calf pain, call your surgeon's office.   Constipation Prevention   Complete by: As directed    Drink plenty of fluids.  Prune juice may be helpful.  You may use a stool softener, such as Colace (over the counter) 100 mg twice a day.  Use MiraLax (over the counter) for constipation as needed.   Diet - low sodium heart healthy   Complete by: As directed    Do not put a pillow under the knee. Place it under the heel.   Complete by: As directed    Driving restrictions   Complete by: As directed    No driving for 2-4 weeks   Increase activity slowly as tolerated   Complete by: As directed    Post-operative opioid taper instructions:   Complete by: As directed    POST-OPERATIVE OPIOID TAPER INSTRUCTIONS: It is important to wean off of  your opioid medication as soon as possible. If you do not need pain medication after your surgery it is ok to stop day one. Opioids include: Codeine, Hydrocodone(Norco, Vicodin), Oxycodone(Percocet, oxycontin) and hydromorphone amongst others.  Long term and even short term use of opiods can cause: Increased pain response Dependence Constipation Depression Respiratory depression And more.  Withdrawal symptoms can include Flu like symptoms Nausea, vomiting And more Techniques to manage these symptoms Hydrate well Eat regular healthy meals Stay active Use relaxation techniques(deep breathing, meditating, yoga) Do Not substitute Alcohol to help with  tapering If you have been on opioids for less than two weeks and do not have pain than it is ok to stop all together.  Plan to wean off of opioids This plan should start within one week post op of your joint replacement. Maintain the same interval or time between taking each dose and first decrease the dose.  Cut the total daily intake of opioids by one tablet each day Next start to increase the time between doses. The last dose that should be eliminated is the evening dose.      TED hose   Complete by: As directed    Use stockings (TED hose) for 2 weeks on both leg(s).  Then for 2 more weeks on the surgical leg.  You may remove them at night for sleeping.        Follow-up Information     Joen Laura, MD. Go on 09/05/2023.   Specialty: Orthopedic Surgery Why: your appointment is scheduled for 11:15 Contact information: [address] [place] [address] [phone]         [facility] Go on 08/24/2023.   Why: your outpatient therapy is scheduled. they will call you with a time Contact information: [address] [place] [address] [phone]                    Discharge Instructions      INSTRUCTIONS AFTER JOINT REPLACEMENT   Remove items at home which could  result in a fall. This includes throw rugs or furniture in walking pathways ICE to the affected joint every three hours while awake for 30 minutes at a time, for at least the first 3-5 days, and then as needed for pain and swelling.  Continue to use ice for pain and swelling. You may notice swelling that will progress down to the foot and ankle.  This is normal after surgery.  Elevate your leg when you are not up walking on it.   Continue to use the breathing machine you got in the hospital (incentive spirometer) which will help keep your temperature down.  It is common for your temperature to cycle up and down following surgery, especially at night when you are not up moving around and exerting yourself.  The breathing machine keeps your lungs expanded and your temperature down.  DIET:  As you were doing prior to hospitalization, we recommend a well-balanced diet.  DRESSING / WOUND CARE / SHOWERING:  Keep the surgical dressing until follow up.  The dressing is water proof, so you can shower without any extra covering.  IF THE DRESSING FALLS OFF or the wound gets wet inside, change the dressing with sterile gauze.  Please use good hand washing techniques before changing the dressing.  Do not use any lotions or creams on the incision until instructed by your surgeon.    ACTIVITY  Increase activity slowly as tolerated, but follow the weight bearing instructions below.   No driving for 6 weeks or until further direction given by your physician.  You cannot drive while taking narcotics.  No lifting or carrying greater than 10 lbs. until further directed by your surgeon. Avoid periods of inactivity such as sitting longer than an hour when not asleep. This helps prevent blood clots.  You may return to work once you are authorized by your doctor.   WEIGHT BEARING: Weight bearing as tolerated with assist device (walker, cane, etc) as directed, use it  as long as suggested by your surgeon or therapist,  typically at least 4-6 weeks.  EXERCISES  Results after joint replacement surgery are often greatly improved when you follow the exercise, range of motion and muscle strengthening exercises prescribed by your doctor. Safety measures are also important to protect the joint from further injury. Any time any of these exercises cause you to have increased pain or swelling, decrease what you are doing until you are comfortable again and then slowly increase them. If you have problems or questions, call your caregiver or physical therapist for advice.   Rehabilitation is important following a joint replacement. After just a few days of immobilization, the muscles of the leg can become weakened and shrink (atrophy).  These exercises are designed to build up the tone and strength of the thigh and leg muscles and to improve motion. Often times heat used for twenty to thirty minutes before working out will loosen up your tissues and help with improving the range of motion but do not use heat for the first two weeks following surgery (sometimes heat can increase post-operative swelling).   These exercises can be done on a training (exercise) mat, on the floor, on a table or on a bed. Use whatever works the best and is most comfortable for you.    Use music or television while you are exercising so that the exercises are a pleasant break in your day. This will make your life better with the exercises acting as a break in your routine that you can look forward to.   Perform all exercises about fifteen times, three times per day or as directed.  You should exercise both the operative leg and the other leg as well.  Exercises include:   Quad Sets - Tighten up the muscle on the front of the thigh (Quad) and hold for 5-10 seconds.   Straight Leg Raises - With your knee straight (if you were given a brace, keep it on), lift the leg to 60 degrees, hold for 3 seconds, and slowly lower the leg.  Perform this exercise  against resistance later as your leg gets stronger.  Leg Slides: Lying on your back, slowly slide your foot toward your buttocks, bending your knee up off the floor (only go as far as is comfortable). Then slowly slide your foot back down until your leg is flat on the floor again.  Angel Wings: Lying on your back spread your legs to the side as far apart as you can without causing discomfort.  Hamstring Strength:  Lying on your back, push your heel against the floor with your leg straight by tightening up the muscles of your buttocks.  Repeat, but this time bend your knee to a comfortable angle, and push your heel against the floor.  You may put a pillow under the heel to make it more comfortable if necessary.   A rehabilitation program following joint replacement surgery can speed recovery and prevent re-injury in the future due to weakened muscles. Contact your doctor or a physical therapist for more information on knee rehabilitation.   CONSTIPATION:  Constipation is defined medically as fewer than three stools per week and severe constipation as less than one stool per week.  Even if you have a regular bowel pattern at home, your normal regimen is likely to be disrupted due to multiple reasons following surgery.  Combination of anesthesia, postoperative narcotics, change in appetite and fluid intake all can affect your bowels.   YOU MUST  use at least one of the following options; they are listed in order of increasing strength to get the job done.  They are all available over the counter, and you may need to use some, POSSIBLY even all of these options:    Drink plenty of fluids (prune juice may be helpful) and high fiber foods Colace 100 mg by mouth twice a day  Senokot for constipation as directed and as needed Dulcolax (bisacodyl), take with full glass of water  Miralax (polyethylene glycol) once or twice a day as needed.  If you have tried all these things and are unable to have a bowel  movement in the first 3-4 days after surgery call either your surgeon or your primary doctor.    If you experience loose stools or diarrhea, hold the medications until you stool forms back up.  If your symptoms do not get better within 1 week or if they get worse, check with your doctor.  If you experience "the worst abdominal pain ever" or develop nausea or vomiting, please contact the office immediately for further recommendations for treatment.  ITCHING:  If you experience itching with your medications, try taking only a single pain pill, or even half a pain pill at a time.  You can also use Benadryl over the counter for itching or also to help with sleep.   TED HOSE STOCKINGS:  Use stockings on both legs until for at least 2 weeks or as directed by physician office. They may be removed at night for sleeping.  MEDICATIONS:  See your medication summary on the "After Visit Summary" that nursing will review with you.  You may have some home medications which will be placed on hold until you complete the course of blood thinner medication.  It is important for you to complete the blood thinner medication as prescribed.  Blood clot prevention (DVT Prophylaxis): After surgery you are at an increased risk for a blood clot. you were prescribed a blood thinner, Aspirin 81mg , to be taken twice daily for a total of 4 weeks from surgery to help reduce your risk of getting a blood clot.  Signs of a pulmonary embolus (blood clot in the lungs) include sudden short of breath, feeling lightheaded or dizzy, chest pain with a deep breath, rapid pulse rapid breathing.  Signs of a blood clot in your arms or legs include new unexplained swelling and cramping, warm, red or darkened skin around the painful area.  Please call the office or 911 right away if these signs or symptoms develop.  PRECAUTIONS:   If you experience chest pain or shortness of breath - call 911 immediately for transfer to the hospital emergency  department.   If you develop a fever greater that 101 F, purulent drainage from wound, increased redness or drainage from wound, foul odor from the wound/dressing, or calf pain - CONTACT YOUR SURGEON.                                                   FOLLOW-UP APPOINTMENTS:  If you do not already have a post-op appointment, please call the office for an appointment to be seen by your surgeon.  Guidelines for how soon to be seen are listed in your "After Visit Summary", but are typically between 2-3 weeks after surgery.  If you have a specialized bandage,  you may be told to follow up 1 week after surgery.  OTHER INSTRUCTIONS:  Knee Replacement:  Do not place pillow under knee, focus on keeping the knee straight while resting.  Place foam block, curve side up under heel at all times except when walking.  DO NOT modify, tear, cut, or change the foam block in any way.  POST-OPERATIVE OPIOID TAPER INSTRUCTIONS: It is important to wean off of your opioid medication as soon as possible. If you do not need pain medication after your surgery it is ok to stop day one. Opioids include: Codeine, Hydrocodone(Norco, Vicodin), Oxycodone(Percocet, oxycontin) and hydromorphone amongst others.  Long term and even short term use of opiods can cause: Increased pain response Dependence Constipation Depression Respiratory depression And more.  Withdrawal symptoms can include Flu like symptoms Nausea, vomiting And more Techniques to manage these symptoms Hydrate well Eat regular healthy meals Stay active Use relaxation techniques(deep breathing, meditating, yoga) Do Not substitute Alcohol to help with tapering If you have been on opioids for less than two weeks and do not have pain than it is ok to stop all together.  Plan to wean off of opioids This plan should start within one week post op of your joint replacement. Maintain the same interval or time between taking each dose and first decrease the dose.   Cut the total daily intake of opioids by one tablet each day Next start to increase the time between doses. The last dose that should be eliminated is the evening dose.   MAKE SURE YOU:  Understand these instructions.  Get help right away if you are not doing well or get worse.    Thank you for letting us be a part of your medical care team.  It is a privilege we respect greatly.  We hope these instructions will help you stay on track for a fast and full recovery!            Signed: Cecil Cobbs 08/22/2023, 7:58 AM

## 2023-08-22 NOTE — TOC Transition Note (Signed)
Transition of Care [facility]) - CM/SW Discharge Note   Patient Details  Name: Jodi Duran MRN: [id number] Date of Birth: 21-Feb-1956  [facility] CM/SW Contact:  Amada Jupiter, LCSW Phone Number: 08/22/2023, 11:50 AM   Clinical Narrative:     Met with pt and confirming she has received RW to room via [facility].  OPPT already set up with [facility].  No further TOC needs.  Final next level of care: OP Rehab Barriers to Discharge: No Barriers Identified   Patient Goals and CMS Choice      Discharge Placement                         Discharge Plan and Services Additional resources added to the After Visit Summary for                  DME Arranged: Walker rolling, CPM DME Agency: [employer]                  Social Determinants of Health (SDOH) Interventions SDOH Screenings   Food Insecurity: No Food Insecurity (08/21/2023)  Housing: Low Risk  (08/21/2023)  Transportation Needs: No Transportation Needs (08/21/2023)  Utilities: Not At Risk (08/21/2023)  Physical Activity: Insufficiently Active (03/07/2023)   Received from [facility]  Stress: No Stress Concern Present (03/07/2023)   Received from [facility]  Tobacco Use: Medium Risk (08/21/2023)  Health Literacy: Low Risk  (03/07/2023)   Received from [facility]     Readmission Risk Interventions     No data to display

## 2023-08-23 ENCOUNTER — Encounter (HOSPITAL_COMMUNITY): Payer: Self-pay | Admitting: Orthopedic Surgery

## 2023-08-24 DIAGNOSIS — Z471 Aftercare following joint replacement surgery: Secondary | ICD-10-CM | POA: Diagnosis not present

## 2023-08-24 DIAGNOSIS — M6281 Muscle weakness (generalized): Secondary | ICD-10-CM | POA: Diagnosis not present

## 2023-08-24 DIAGNOSIS — M25562 Pain in left knee: Secondary | ICD-10-CM | POA: Diagnosis not present

## 2023-08-24 DIAGNOSIS — R262 Difficulty in walking, not elsewhere classified: Secondary | ICD-10-CM | POA: Diagnosis not present

## 2023-08-28 DIAGNOSIS — R262 Difficulty in walking, not elsewhere classified: Secondary | ICD-10-CM | POA: Diagnosis not present

## 2023-08-28 DIAGNOSIS — Z471 Aftercare following joint replacement surgery: Secondary | ICD-10-CM | POA: Diagnosis not present

## 2023-08-28 DIAGNOSIS — M25562 Pain in left knee: Secondary | ICD-10-CM | POA: Diagnosis not present

## 2023-08-28 DIAGNOSIS — M6281 Muscle weakness (generalized): Secondary | ICD-10-CM | POA: Diagnosis not present

## 2023-08-30 DIAGNOSIS — M25562 Pain in left knee: Secondary | ICD-10-CM | POA: Diagnosis not present

## 2023-08-30 DIAGNOSIS — M6281 Muscle weakness (generalized): Secondary | ICD-10-CM | POA: Diagnosis not present

## 2023-08-30 DIAGNOSIS — R262 Difficulty in walking, not elsewhere classified: Secondary | ICD-10-CM | POA: Diagnosis not present

## 2023-08-30 DIAGNOSIS — Z471 Aftercare following joint replacement surgery: Secondary | ICD-10-CM | POA: Diagnosis not present

## 2023-09-04 DIAGNOSIS — Z471 Aftercare following joint replacement surgery: Secondary | ICD-10-CM | POA: Diagnosis not present

## 2023-09-04 DIAGNOSIS — M6281 Muscle weakness (generalized): Secondary | ICD-10-CM | POA: Diagnosis not present

## 2023-09-04 DIAGNOSIS — R262 Difficulty in walking, not elsewhere classified: Secondary | ICD-10-CM | POA: Diagnosis not present

## 2023-09-04 DIAGNOSIS — M25562 Pain in left knee: Secondary | ICD-10-CM | POA: Diagnosis not present

## 2023-09-07 DIAGNOSIS — M1712 Unilateral primary osteoarthritis, left knee: Secondary | ICD-10-CM | POA: Diagnosis not present

## 2023-09-08 ENCOUNTER — Ambulatory Visit
Admission: RE | Admit: 2023-09-08 | Discharge: 2023-09-08 | Disposition: A | Discharge status: Home / Self Care | Payer: Self-pay | Source: Ambulatory Visit | Attending: Family Medicine | Admitting: Family Medicine

## 2023-09-08 VITALS — BP 129/80 | HR 78 | Temp 97.9°F | Resp 18

## 2023-09-08 DIAGNOSIS — M25522 Pain in left elbow: Secondary | ICD-10-CM

## 2023-09-08 DIAGNOSIS — S161XXA Strain of muscle, fascia and tendon at neck level, initial encounter: Secondary | ICD-10-CM

## 2023-09-08 DIAGNOSIS — Z471 Aftercare following joint replacement surgery: Secondary | ICD-10-CM | POA: Diagnosis not present

## 2023-09-08 DIAGNOSIS — M6281 Muscle weakness (generalized): Secondary | ICD-10-CM | POA: Diagnosis not present

## 2023-09-08 DIAGNOSIS — R262 Difficulty in walking, not elsewhere classified: Secondary | ICD-10-CM | POA: Diagnosis not present

## 2023-09-08 DIAGNOSIS — M25562 Pain in left knee: Secondary | ICD-10-CM | POA: Diagnosis not present

## 2023-09-08 DIAGNOSIS — M25521 Pain in right elbow: Secondary | ICD-10-CM

## 2023-09-08 DIAGNOSIS — M545 Low back pain, unspecified: Secondary | ICD-10-CM
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
## 2023-09-08 NOTE — Discharge Instructions (Signed)
Continue taking the muscle relaxer, pain medications, using heat, massage, Biofreeze, rest.  Follow-up for significantly worsening symptoms.
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
## 2023-09-11 DIAGNOSIS — R262 Difficulty in walking, not elsewhere classified: Secondary | ICD-10-CM | POA: Diagnosis not present

## 2023-09-11 DIAGNOSIS — M6281 Muscle weakness (generalized): Secondary | ICD-10-CM | POA: Diagnosis not present

## 2023-09-11 DIAGNOSIS — M25562 Pain in left knee: Secondary | ICD-10-CM | POA: Diagnosis not present

## 2023-09-11 DIAGNOSIS — Z471 Aftercare following joint replacement surgery: Secondary | ICD-10-CM | POA: Diagnosis not present

## 2023-09-14 DIAGNOSIS — R262 Difficulty in walking, not elsewhere classified: Secondary | ICD-10-CM | POA: Diagnosis not present

## 2023-09-14 DIAGNOSIS — Z471 Aftercare following joint replacement surgery: Secondary | ICD-10-CM | POA: Diagnosis not present

## 2023-09-14 DIAGNOSIS — M25562 Pain in left knee: Secondary | ICD-10-CM | POA: Diagnosis not present

## 2023-09-14 DIAGNOSIS — M6281 Muscle weakness (generalized): Secondary | ICD-10-CM | POA: Diagnosis not present

## 2023-09-19 DIAGNOSIS — M25562 Pain in left knee: Secondary | ICD-10-CM | POA: Diagnosis not present

## 2023-09-19 DIAGNOSIS — R262 Difficulty in walking, not elsewhere classified: Secondary | ICD-10-CM | POA: Diagnosis not present

## 2023-09-19 DIAGNOSIS — Z471 Aftercare following joint replacement surgery: Secondary | ICD-10-CM | POA: Diagnosis not present

## 2023-09-19 DIAGNOSIS — M6281 Muscle weakness (generalized): Secondary | ICD-10-CM | POA: Diagnosis not present

## 2023-09-21 DIAGNOSIS — M25562 Pain in left knee: Secondary | ICD-10-CM | POA: Diagnosis not present

## 2023-09-21 DIAGNOSIS — R262 Difficulty in walking, not elsewhere classified: Secondary | ICD-10-CM | POA: Diagnosis not present

## 2023-09-21 DIAGNOSIS — M6281 Muscle weakness (generalized): Secondary | ICD-10-CM | POA: Diagnosis not present

## 2023-09-21 DIAGNOSIS — Z471 Aftercare following joint replacement surgery: Secondary | ICD-10-CM | POA: Diagnosis not present

## 2023-09-25 DIAGNOSIS — R262 Difficulty in walking, not elsewhere classified: Secondary | ICD-10-CM | POA: Diagnosis not present

## 2023-09-25 DIAGNOSIS — Z471 Aftercare following joint replacement surgery: Secondary | ICD-10-CM | POA: Diagnosis not present

## 2023-09-25 DIAGNOSIS — M25562 Pain in left knee: Secondary | ICD-10-CM | POA: Diagnosis not present

## 2023-09-25 DIAGNOSIS — M6281 Muscle weakness (generalized): Secondary | ICD-10-CM | POA: Diagnosis not present

## 2023-10-02 DIAGNOSIS — M6281 Muscle weakness (generalized): Secondary | ICD-10-CM | POA: Diagnosis not present

## 2023-10-02 DIAGNOSIS — Z471 Aftercare following joint replacement surgery: Secondary | ICD-10-CM | POA: Diagnosis not present

## 2023-10-02 DIAGNOSIS — M25562 Pain in left knee: Secondary | ICD-10-CM | POA: Diagnosis not present

## 2023-10-02 DIAGNOSIS — R262 Difficulty in walking, not elsewhere classified: Secondary | ICD-10-CM | POA: Diagnosis not present

## 2023-10-05 DIAGNOSIS — M1712 Unilateral primary osteoarthritis, left knee: Secondary | ICD-10-CM | POA: Diagnosis not present

## 2023-10-09 DIAGNOSIS — Z471 Aftercare following joint replacement surgery: Secondary | ICD-10-CM | POA: Diagnosis not present

## 2023-10-09 DIAGNOSIS — M25562 Pain in left knee: Secondary | ICD-10-CM | POA: Diagnosis not present

## 2023-10-09 DIAGNOSIS — R262 Difficulty in walking, not elsewhere classified: Secondary | ICD-10-CM | POA: Diagnosis not present

## 2023-10-09 DIAGNOSIS — M6281 Muscle weakness (generalized): Secondary | ICD-10-CM | POA: Diagnosis not present

## 2023-11-23 DIAGNOSIS — M25561 Pain in right knee: Secondary | ICD-10-CM | POA: Diagnosis not present

## 2023-11-23 DIAGNOSIS — M25522 Pain in left elbow: Secondary | ICD-10-CM | POA: Diagnosis not present

## 2023-11-24 ENCOUNTER — Other Ambulatory Visit: Payer: Self-pay | Admitting: Orthopedic Surgery

## 2023-11-24 DIAGNOSIS — M25522 Pain in left elbow: Secondary | ICD-10-CM

## 2023-11-29 ENCOUNTER — Ambulatory Visit
Admission: RE | Admit: 2023-11-29 | Discharge: 2023-11-29 | Disposition: A | Discharge status: Home / Self Care | Source: Ambulatory Visit | Attending: Orthopedic Surgery | Admitting: Orthopedic Surgery

## 2023-11-29 DIAGNOSIS — M25522 Pain in left elbow: Secondary | ICD-10-CM

## 2023-11-29 DIAGNOSIS — M19022 Primary osteoarthritis, left elbow: Secondary | ICD-10-CM | POA: Diagnosis not present

## 2023-11-29 DIAGNOSIS — M25422 Effusion, left elbow: Secondary | ICD-10-CM | POA: Diagnosis not present

## 2023-12-11 DIAGNOSIS — M6158 Other ossification of muscle, other site: Secondary | ICD-10-CM | POA: Diagnosis not present

## 2023-12-11 DIAGNOSIS — M65822 Other synovitis and tenosynovitis, left upper arm: Secondary | ICD-10-CM | POA: Diagnosis not present

## 2023-12-11 DIAGNOSIS — S46302A Unspecified injury of muscle, fascia and tendon of triceps, left arm, initial encounter: Secondary | ICD-10-CM | POA: Diagnosis not present

## 2023-12-11 DIAGNOSIS — M25722 Osteophyte, left elbow: Secondary | ICD-10-CM | POA: Diagnosis not present

## 2023-12-11 DIAGNOSIS — M24522 Contracture, left elbow: Secondary | ICD-10-CM | POA: Diagnosis not present

## 2023-12-11 DIAGNOSIS — G8918 Other acute postprocedural pain: Secondary | ICD-10-CM | POA: Diagnosis not present

## 2023-12-16 DIAGNOSIS — I10 Essential (primary) hypertension: Secondary | ICD-10-CM | POA: Diagnosis not present

## 2023-12-16 DIAGNOSIS — M069 Rheumatoid arthritis, unspecified: Secondary | ICD-10-CM | POA: Diagnosis not present

## 2023-12-16 DIAGNOSIS — M1710 Unilateral primary osteoarthritis, unspecified knee: Secondary | ICD-10-CM | POA: Diagnosis not present

## 2023-12-16 DIAGNOSIS — E7800 Pure hypercholesterolemia, unspecified: Secondary | ICD-10-CM | POA: Diagnosis not present

## 2023-12-16 DIAGNOSIS — M1711 Unilateral primary osteoarthritis, right knee: Secondary | ICD-10-CM | POA: Diagnosis not present

## 2023-12-16 DIAGNOSIS — M150 Primary generalized (osteo)arthritis: Secondary | ICD-10-CM | POA: Diagnosis not present

## 2023-12-19 DIAGNOSIS — M25522 Pain in left elbow: Secondary | ICD-10-CM | POA: Diagnosis not present

## 2023-12-20 DIAGNOSIS — M6281 Muscle weakness (generalized): Secondary | ICD-10-CM | POA: Diagnosis not present

## 2023-12-20 DIAGNOSIS — M25562 Pain in left knee: Secondary | ICD-10-CM | POA: Diagnosis not present

## 2023-12-20 DIAGNOSIS — M25522 Pain in left elbow: Secondary | ICD-10-CM | POA: Diagnosis not present

## 2023-12-26 DIAGNOSIS — M25522 Pain in left elbow: Secondary | ICD-10-CM | POA: Diagnosis not present

## 2023-12-27 DIAGNOSIS — M25522 Pain in left elbow: Secondary | ICD-10-CM | POA: Diagnosis not present

## 2023-12-27 DIAGNOSIS — M6281 Muscle weakness (generalized): Secondary | ICD-10-CM | POA: Diagnosis not present

## 2023-12-27 DIAGNOSIS — Z4789 Encounter for other orthopedic aftercare: Secondary | ICD-10-CM | POA: Diagnosis not present

## 2024-01-04 DIAGNOSIS — Z4789 Encounter for other orthopedic aftercare: Secondary | ICD-10-CM | POA: Diagnosis not present

## 2024-01-04 DIAGNOSIS — M6281 Muscle weakness (generalized): Secondary | ICD-10-CM | POA: Diagnosis not present

## 2024-01-04 DIAGNOSIS — M25522 Pain in left elbow: Secondary | ICD-10-CM | POA: Diagnosis not present

## 2024-01-10 DIAGNOSIS — M25522 Pain in left elbow: Secondary | ICD-10-CM | POA: Diagnosis not present

## 2024-01-10 DIAGNOSIS — M6281 Muscle weakness (generalized): Secondary | ICD-10-CM | POA: Diagnosis not present

## 2024-01-10 DIAGNOSIS — Z4789 Encounter for other orthopedic aftercare: Secondary | ICD-10-CM | POA: Diagnosis not present

## 2024-01-17 DIAGNOSIS — Z4789 Encounter for other orthopedic aftercare: Secondary | ICD-10-CM | POA: Diagnosis not present

## 2024-01-17 DIAGNOSIS — M6281 Muscle weakness (generalized): Secondary | ICD-10-CM | POA: Diagnosis not present

## 2024-01-17 DIAGNOSIS — M25522 Pain in left elbow: Secondary | ICD-10-CM | POA: Diagnosis not present

## 2024-01-18 DIAGNOSIS — H43393 Other vitreous opacities, bilateral: Secondary | ICD-10-CM | POA: Diagnosis not present

## 2024-01-19 DIAGNOSIS — M25522 Pain in left elbow: Secondary | ICD-10-CM | POA: Diagnosis not present

## 2024-01-31 DIAGNOSIS — M6281 Muscle weakness (generalized): Secondary | ICD-10-CM | POA: Diagnosis not present

## 2024-01-31 DIAGNOSIS — Z4789 Encounter for other orthopedic aftercare: Secondary | ICD-10-CM | POA: Diagnosis not present

## 2024-01-31 DIAGNOSIS — M25522 Pain in left elbow: Secondary | ICD-10-CM | POA: Diagnosis not present

## 2024-02-07 DIAGNOSIS — Z4789 Encounter for other orthopedic aftercare: Secondary | ICD-10-CM | POA: Diagnosis not present

## 2024-02-07 DIAGNOSIS — M6281 Muscle weakness (generalized): Secondary | ICD-10-CM | POA: Diagnosis not present

## 2024-02-07 DIAGNOSIS — M25522 Pain in left elbow: Secondary | ICD-10-CM | POA: Diagnosis not present

## 2024-02-14 DIAGNOSIS — M6281 Muscle weakness (generalized): Secondary | ICD-10-CM | POA: Diagnosis not present

## 2024-02-14 DIAGNOSIS — Z4789 Encounter for other orthopedic aftercare: Secondary | ICD-10-CM | POA: Diagnosis not present

## 2024-02-14 DIAGNOSIS — M25522 Pain in left elbow: Secondary | ICD-10-CM | POA: Diagnosis not present

## 2024-02-21 DIAGNOSIS — M25522 Pain in left elbow: Secondary | ICD-10-CM | POA: Diagnosis not present

## 2024-02-21 DIAGNOSIS — M6281 Muscle weakness (generalized): Secondary | ICD-10-CM | POA: Diagnosis not present

## 2024-02-21 DIAGNOSIS — Z4789 Encounter for other orthopedic aftercare: Secondary | ICD-10-CM | POA: Diagnosis not present

## 2024-02-28 DIAGNOSIS — M25522 Pain in left elbow: Secondary | ICD-10-CM | POA: Diagnosis not present

## 2024-02-28 DIAGNOSIS — Z4789 Encounter for other orthopedic aftercare: Secondary | ICD-10-CM | POA: Diagnosis not present

## 2024-02-28 DIAGNOSIS — M6281 Muscle weakness (generalized): Secondary | ICD-10-CM | POA: Diagnosis not present

## 2024-03-20 DIAGNOSIS — M25522 Pain in left elbow: Secondary | ICD-10-CM | POA: Diagnosis not present

## 2024-03-20 DIAGNOSIS — Z4789 Encounter for other orthopedic aftercare: Secondary | ICD-10-CM | POA: Diagnosis not present

## 2024-03-20 DIAGNOSIS — M6281 Muscle weakness (generalized): Secondary | ICD-10-CM | POA: Diagnosis not present

## 2024-04-16 DIAGNOSIS — R600 Localized edema: Secondary | ICD-10-CM | POA: Diagnosis not present

## 2024-04-16 DIAGNOSIS — M1711 Unilateral primary osteoarthritis, right knee: Secondary | ICD-10-CM | POA: Diagnosis not present

## 2024-04-16 DIAGNOSIS — M150 Primary generalized (osteo)arthritis: Secondary | ICD-10-CM | POA: Diagnosis not present

## 2024-04-16 DIAGNOSIS — I10 Essential (primary) hypertension: Secondary | ICD-10-CM | POA: Diagnosis not present

## 2024-04-16 DIAGNOSIS — E7800 Pure hypercholesterolemia, unspecified: Secondary | ICD-10-CM | POA: Diagnosis not present

## 2024-06-04 DIAGNOSIS — R600 Localized edema: Secondary | ICD-10-CM | POA: Diagnosis not present

## 2024-06-04 DIAGNOSIS — N390 Urinary tract infection, site not specified: Secondary | ICD-10-CM | POA: Diagnosis not present

## 2024-06-04 DIAGNOSIS — I872 Venous insufficiency (chronic) (peripheral): Secondary | ICD-10-CM | POA: Diagnosis not present

## 2024-06-04 DIAGNOSIS — I10 Essential (primary) hypertension: Secondary | ICD-10-CM | POA: Diagnosis not present

## 2024-06-04 DIAGNOSIS — M1711 Unilateral primary osteoarthritis, right knee: Secondary | ICD-10-CM | POA: Diagnosis not present

## 2024-06-04 DIAGNOSIS — M150 Primary generalized (osteo)arthritis: Secondary | ICD-10-CM | POA: Diagnosis not present

## 2024-06-04 DIAGNOSIS — E7800 Pure hypercholesterolemia, unspecified: Secondary | ICD-10-CM | POA: Diagnosis not present

## 2024-06-04 DIAGNOSIS — Z0001 Encounter for general adult medical examination with abnormal findings: Secondary | ICD-10-CM | POA: Diagnosis not present

## 2024-06-04 DIAGNOSIS — Z23 Encounter for immunization: Secondary | ICD-10-CM | POA: Diagnosis not present

## 2024-07-17 ENCOUNTER — Encounter (INDEPENDENT_AMBULATORY_CARE_PROVIDER_SITE_OTHER): Payer: Self-pay | Admitting: *Deleted

## 2024-07-18 ENCOUNTER — Other Ambulatory Visit (HOSPITAL_COMMUNITY): Payer: Self-pay | Admitting: Family Medicine

## 2024-07-18 DIAGNOSIS — Z1231 Encounter for screening mammogram for malignant neoplasm of breast: Secondary | ICD-10-CM

## 2024-07-29 ENCOUNTER — Encounter (HOSPITAL_COMMUNITY): Payer: Self-pay

## 2024-07-29 ENCOUNTER — Ambulatory Visit (HOSPITAL_COMMUNITY)
Admission: RE | Admit: 2024-07-29 | Discharge: 2024-07-29 | Disposition: A | Discharge status: Home / Self Care | Source: Ambulatory Visit | Attending: Family Medicine | Admitting: Family Medicine

## 2024-07-29 DIAGNOSIS — Z1231 Encounter for screening mammogram for malignant neoplasm of breast: Secondary | ICD-10-CM | POA: Diagnosis present
